# Patient Record
Sex: Female | Born: 1970 | Race: White | Hispanic: No | Smoking: Former smoker
Health system: Southern US, Community
[De-identification: ages and names within clinical notes are randomized; demographics above are authoritative.]

## PROBLEM LIST (undated history)

## (undated) DIAGNOSIS — I8290 Acute embolism and thrombosis of unspecified vein: Secondary | ICD-10-CM

## (undated) DIAGNOSIS — E119 Type 2 diabetes mellitus without complications: Secondary | ICD-10-CM

## (undated) HISTORY — PX: CHOLECYSTECTOMY: SHX55

## (undated) HISTORY — PX: ABDOMINAL SURGERY: SHX537

## (undated) HISTORY — PX: BACK SURGERY: SHX140

## (undated) HISTORY — PX: LEG SURGERY: SHX1003

---

## 2017-09-06 ENCOUNTER — Emergency Department (HOSPITAL_COMMUNITY): Admission: EM | Admit: 2017-09-06 | Discharge: 2017-09-06 | Discharge status: Against Medical Advice | Payer: Self-pay

## 2017-11-11 ENCOUNTER — Other Ambulatory Visit: Payer: Self-pay

## 2017-11-11 DIAGNOSIS — I872 Venous insufficiency (chronic) (peripheral): Secondary | ICD-10-CM

## 2017-11-11 DIAGNOSIS — I739 Peripheral vascular disease, unspecified: Secondary | ICD-10-CM

## 2017-11-25 ENCOUNTER — Other Ambulatory Visit: Payer: Self-pay | Admitting: Orthopaedic Surgery

## 2017-11-25 DIAGNOSIS — M545 Low back pain: Secondary | ICD-10-CM

## 2017-12-08 ENCOUNTER — Inpatient Hospital Stay
Admission: RE | Admit: 2017-12-08 | Discharge: 2017-12-08 | Disposition: A | Discharge status: Home / Self Care | Payer: Medicare Other | Source: Ambulatory Visit | Attending: Orthopaedic Surgery | Admitting: Orthopaedic Surgery

## 2017-12-13 ENCOUNTER — Other Ambulatory Visit: Payer: Self-pay | Admitting: *Deleted

## 2017-12-13 ENCOUNTER — Encounter: Payer: Self-pay | Admitting: Surgery

## 2017-12-13 ENCOUNTER — Encounter: Payer: Self-pay | Admitting: *Deleted

## 2017-12-13 ENCOUNTER — Encounter

## 2017-12-13 ENCOUNTER — Ambulatory Visit (INDEPENDENT_AMBULATORY_CARE_PROVIDER_SITE_OTHER): Payer: Medicare Other | Admitting: Surgery

## 2017-12-13 ENCOUNTER — Ambulatory Visit (HOSPITAL_COMMUNITY)
Admission: RE | Admit: 2017-12-13 | Discharge: 2017-12-13 | Disposition: A | Discharge status: Home / Self Care | Payer: Medicare Other | Source: Ambulatory Visit | Attending: Surgery | Admitting: Surgery

## 2017-12-13 VITALS — BP 130/90 | HR 89 | Temp 98.2°F | Resp 18 | Ht 67.2 in | Wt 238.2 lb

## 2017-12-13 DIAGNOSIS — I739 Peripheral vascular disease, unspecified: Secondary | ICD-10-CM

## 2017-12-13 DIAGNOSIS — R9439 Abnormal result of other cardiovascular function study: Secondary | ICD-10-CM | POA: Insufficient documentation
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
## 2017-12-14 ENCOUNTER — Ambulatory Visit
Admission: RE | Admit: 2017-12-14 | Discharge: 2017-12-14 | Disposition: A | Discharge status: Home / Self Care | Payer: Medicare Other | Source: Ambulatory Visit | Attending: Orthopaedic Surgery | Admitting: Orthopaedic Surgery

## 2017-12-14 DIAGNOSIS — M545 Low back pain: Secondary | ICD-10-CM

## 2018-01-04 ENCOUNTER — Encounter (HOSPITAL_COMMUNITY): Admission: RE | Payer: Self-pay | Source: Ambulatory Visit

## 2018-01-04 ENCOUNTER — Ambulatory Visit (HOSPITAL_COMMUNITY): Admission: RE | Admit: 2018-01-04 | Payer: Medicare Other | Source: Ambulatory Visit | Admitting: Surgery

## 2018-01-04 SURGERY — LOWER EXTREMITY ANGIOGRAPHY
Anesthesia: LOCAL

## 2018-01-09 ENCOUNTER — Encounter (HOSPITAL_COMMUNITY): Payer: Self-pay | Admitting: Emergency Medicine

## 2018-01-09 ENCOUNTER — Emergency Department (HOSPITAL_COMMUNITY)
Admission: EM | Admit: 2018-01-09 | Discharge: 2018-01-09 | Disposition: A | Discharge status: Home / Self Care | Payer: Medicare Other | Attending: Emergency Medicine | Admitting: Emergency Medicine

## 2018-01-09 ENCOUNTER — Emergency Department (HOSPITAL_COMMUNITY): Payer: Medicare Other

## 2018-01-09 DIAGNOSIS — R1030 Lower abdominal pain, unspecified: Secondary | ICD-10-CM | POA: Diagnosis present

## 2018-01-09 DIAGNOSIS — Z7984 Long term (current) use of oral hypoglycemic drugs: Secondary | ICD-10-CM | POA: Insufficient documentation

## 2018-01-09 DIAGNOSIS — A084 Viral intestinal infection, unspecified: Secondary | ICD-10-CM | POA: Diagnosis not present

## 2018-01-09 DIAGNOSIS — G8929 Other chronic pain: Secondary | ICD-10-CM | POA: Insufficient documentation

## 2018-01-09 DIAGNOSIS — Z79899 Other long term (current) drug therapy: Secondary | ICD-10-CM | POA: Diagnosis not present

## 2018-01-09 DIAGNOSIS — R110 Nausea: Secondary | ICD-10-CM

## 2018-01-09 DIAGNOSIS — R197 Diarrhea, unspecified: Secondary | ICD-10-CM

## 2018-01-09 DIAGNOSIS — M549 Dorsalgia, unspecified: Secondary | ICD-10-CM | POA: Diagnosis not present

## 2018-01-09 LAB — CBC
HEMATOCRIT: 44.5 % (ref 36.0–46.0)
HEMOGLOBIN: 15.2 g/dL — AB (ref 12.0–15.0)
MCH: 30 pg (ref 26.0–34.0)
MCHC: 34.2 g/dL (ref 30.0–36.0)
MCV: 87.8 fL (ref 78.0–100.0)
Platelets: 276 10*3/uL (ref 150–400)
RBC: 5.07 MIL/uL (ref 3.87–5.11)
RDW: 13.5 % (ref 11.5–15.5)
WBC: 10.7 10*3/uL — AB (ref 4.0–10.5)

## 2018-01-09 LAB — COMPREHENSIVE METABOLIC PANEL
ALT: 20 U/L (ref 0–44)
AST: 40 U/L (ref 15–41)
Albumin: 3.7 g/dL (ref 3.5–5.0)
Alkaline Phosphatase: 69 U/L (ref 38–126)
Anion gap: 16 — ABNORMAL HIGH (ref 5–15)
BILIRUBIN TOTAL: 0.8 mg/dL (ref 0.3–1.2)
BUN: 6 mg/dL (ref 6–20)
CO2: 17 mmol/L — AB (ref 22–32)
Calcium: 9.3 mg/dL (ref 8.9–10.3)
Chloride: 101 mmol/L (ref 98–111)
Creatinine, Ser: 0.59 mg/dL (ref 0.44–1.00)
GFR calc Af Amer: >60 mL/min (ref >60)
GFR calc non Af Amer: >60 mL/min (ref >60)
Glucose, Bld: 162 mg/dL — ABNORMAL HIGH (ref 70–99)
POTASSIUM: 4 mmol/L (ref 3.5–5.1)
SODIUM: 134 mmol/L — AB (ref 135–145)
TOTAL PROTEIN: 7.4 g/dL (ref 6.5–8.1)

## 2018-01-09 LAB — I-STAT BETA HCG BLOOD, ED (MC, WL, AP ONLY): I-stat hCG, quantitative: <5 m[IU]/mL (ref <5)

## 2018-01-09 LAB — URINALYSIS, ROUTINE W REFLEX MICROSCOPIC
Bilirubin Urine: NEGATIVE
Glucose, UA: NEGATIVE mg/dL
Hgb urine dipstick: NEGATIVE
Ketones, ur: 5 mg/dL — AB
LEUKOCYTES UA: NEGATIVE
NITRITE: NEGATIVE
PH: 5 (ref 5.0–8.0)
Protein, ur: NEGATIVE mg/dL
Specific Gravity, Urine: 1.021 (ref 1.005–1.030)

## 2018-01-09 LAB — BLOOD GAS, VENOUS
Acid-Base Excess: 0.5 mmol/L (ref 0.0–2.0)
Bicarbonate: 24.1 mmol/L (ref 20.0–28.0)
FIO2: 21
O2 Saturation: 83.2 %
PO2 VEN: 45.5 mmHg — AB (ref 32.0–45.0)
Patient temperature: 98.4
pCO2, Ven: 37.1 mmHg — ABNORMAL LOW (ref 44.0–60.0)
pH, Ven: 7.428 (ref 7.250–7.430)

## 2018-01-09 LAB — LIPASE, BLOOD: Lipase: 45 U/L (ref 11–51)

## 2018-01-09 MED ORDER — ONDANSETRON HCL 4 MG/2ML IJ SOLN
4.0000 mg | Freq: Once | INTRAMUSCULAR | Status: AC
  Administered 2018-01-09: 4 mg via INTRAVENOUS
  Filled 2018-01-09: qty 2

## 2018-01-09 MED ORDER — DICYCLOMINE HCL 20 MG PO TABS: 20.0000 mg | ORAL_TABLET | Freq: Three times a day (TID) | ORAL | 0 refills | Status: DC | PRN

## 2018-01-09 MED ORDER — SODIUM CHLORIDE 0.9 % IV BOLUS
2000.0000 mL | Freq: Once | INTRAVENOUS | Status: AC
  Administered 2018-01-09: 2000 mL via INTRAVENOUS

## 2018-01-09 MED ORDER — ONDANSETRON 4 MG PO TBDP: 4.0000 mg | ORAL_TABLET | Freq: Three times a day (TID) | ORAL | 0 refills | Status: DC | PRN

## 2018-01-09 MED ORDER — IOPAMIDOL (ISOVUE-300) INJECTION 61%
100.0000 mL | Freq: Once | INTRAVENOUS | Status: AC | PRN
  Administered 2018-01-09: 100 mL via INTRAVENOUS

## 2018-01-09 MED ORDER — MORPHINE SULFATE (PF) 4 MG/ML IV SOLN
4.0000 mg | Freq: Once | INTRAVENOUS | Status: AC
  Administered 2018-01-09: 4 mg via INTRAVENOUS
  Filled 2018-01-09: qty 1

## 2018-01-09 MED ORDER — IOPAMIDOL (ISOVUE-300) INJECTION 61%
INTRAVENOUS | Status: AC
  Filled 2018-01-09: qty 100
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
## 2018-01-09 NOTE — ED Triage Notes (Addendum)
Patient here from home with complaints of abdominal pain. Nausea, states that she is on an antibiotic for colitis. States that she has abdominal distention and has put herself on a liquid diet. Doxy 100mg  twice daily.

## 2018-01-09 NOTE — ED Notes (Signed)
Urine culture sent down with UA. 

## 2018-01-09 NOTE — Discharge Instructions (Addendum)
Your work up today has been very reassuring, your CT scan did not show any signs of colitis or diverticulitis. You possibly could have a viral gastroenteritis illness, which will resolve on it's own, however you should continue your doxycycline until you've finished the entire prescription. Use zofran as prescribed, as needed for nausea. Alternate between tylenol and motrin as needed for pain. May consider using over the counter tums, maalox, pepto bismol, or other over the counter remedies to help with symptoms. Stay well hydrated with small sips of fluids throughout the day. Use bentyl as directed to help with diarrhea and abdominal pain. Follow a BRAT (banana-rice-applesauce-toast) diet as described below for the next 24-48 hours. The 'BRAT' diet is suggested, then progress to diet as tolerated as symptoms abate. Call your regular doctor if bloody stools, persistent diarrhea, vomiting, fever or abdominal pain. Follow up with your regular doctor in 1 week for recheck of symptoms. Return to ER for changing or worsening of symptoms.

## 2018-05-08 ENCOUNTER — Ambulatory Visit (HOSPITAL_COMMUNITY)
Admission: EM | Admit: 2018-05-08 | Discharge: 2018-05-08 | Disposition: A | Discharge status: Home / Self Care | Payer: Medicare Other | Attending: Family Medicine | Admitting: Family Medicine

## 2018-05-08 ENCOUNTER — Ambulatory Visit (HOSPITAL_COMMUNITY): Payer: Medicare Other

## 2018-05-08 ENCOUNTER — Encounter (HOSPITAL_COMMUNITY): Payer: Self-pay | Admitting: Emergency Medicine

## 2018-05-08 DIAGNOSIS — B9789 Other viral agents as the cause of diseases classified elsewhere: Secondary | ICD-10-CM

## 2018-05-08 DIAGNOSIS — J069 Acute upper respiratory infection, unspecified: Secondary | ICD-10-CM

## 2018-05-08 HISTORY — DX: Acute embolism and thrombosis of unspecified vein: I82.90

## 2018-05-08 HISTORY — DX: Type 2 diabetes mellitus without complications: E11.9

## 2018-05-08 NOTE — ED Triage Notes (Signed)
Pt sts URI sx with congestion and cough
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
## 2018-05-08 NOTE — ED Notes (Signed)
Patient was sitting in hall, had pulled chair out into the hallway, was upset with the temperature in room. Patient refused CXR exam.

## 2018-10-14 ENCOUNTER — Other Ambulatory Visit: Payer: Self-pay

## 2018-10-14 DIAGNOSIS — I739 Peripheral vascular disease, unspecified: Secondary | ICD-10-CM

## 2018-10-24 ENCOUNTER — Other Ambulatory Visit: Payer: Self-pay

## 2018-10-24 ENCOUNTER — Ambulatory Visit (INDEPENDENT_AMBULATORY_CARE_PROVIDER_SITE_OTHER): Payer: Medicare Other | Admitting: Surgery

## 2018-10-24 ENCOUNTER — Ambulatory Visit (HOSPITAL_COMMUNITY)
Admission: RE | Admit: 2018-10-24 | Discharge: 2018-10-24 | Disposition: A | Discharge status: Home / Self Care | Payer: Medicare Other | Source: Ambulatory Visit | Attending: Family | Admitting: Family

## 2018-10-24 ENCOUNTER — Encounter: Payer: Self-pay | Admitting: Surgery

## 2018-10-24 VITALS — BP 98/68 | HR 85 | Temp 99.1°F | Ht 67.0 in | Wt 221.9 lb

## 2018-10-24 DIAGNOSIS — I739 Peripheral vascular disease, unspecified: Secondary | ICD-10-CM | POA: Diagnosis not present

## 2018-10-24 MED ORDER — ROSUVASTATIN CALCIUM 10 MG PO TABS: 10.0000 mg | ORAL_TABLET | Freq: Every day | ORAL | 11 refills | Status: AC

## 2018-10-24 MED ORDER — CILOSTAZOL 100 MG PO TABS: 100.0000 mg | ORAL_TABLET | Freq: Two times a day (BID) | ORAL | 11 refills | Status: DC
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
## 2019-01-16 ENCOUNTER — Other Ambulatory Visit: Payer: Self-pay

## 2019-01-16 ENCOUNTER — Emergency Department (HOSPITAL_COMMUNITY)
Admission: EM | Admit: 2019-01-16 | Discharge: 2019-01-16 | Disposition: A | Discharge status: Home / Self Care | Payer: Medicare Other | Attending: Emergency Medicine | Admitting: Emergency Medicine

## 2019-01-16 ENCOUNTER — Encounter (HOSPITAL_COMMUNITY): Payer: Self-pay | Admitting: Emergency Medicine

## 2019-01-16 DIAGNOSIS — Z79899 Other long term (current) drug therapy: Secondary | ICD-10-CM | POA: Insufficient documentation

## 2019-01-16 DIAGNOSIS — Z87891 Personal history of nicotine dependence: Secondary | ICD-10-CM | POA: Diagnosis not present

## 2019-01-16 DIAGNOSIS — R070 Pain in throat: Secondary | ICD-10-CM | POA: Insufficient documentation

## 2019-01-16 DIAGNOSIS — Z7984 Long term (current) use of oral hypoglycemic drugs: Secondary | ICD-10-CM | POA: Diagnosis not present

## 2019-01-16 DIAGNOSIS — E119 Type 2 diabetes mellitus without complications: Secondary | ICD-10-CM | POA: Diagnosis not present

## 2019-01-16 DIAGNOSIS — Z77098 Contact with and (suspected) exposure to other hazardous, chiefly nonmedicinal, chemicals: Secondary | ICD-10-CM

## 2019-01-16 MED ORDER — LIDOCAINE VISCOUS HCL 2 % MT SOLN: 15.0000 mL | OROMUCOSAL | 0 refills | Status: DC | PRN

## 2019-01-16 MED ORDER — LIDOCAINE VISCOUS HCL 2 % MT SOLN
15.0000 mL | Freq: Once | OROMUCOSAL | Status: AC
  Administered 2019-01-16: 15 mL via OROMUCOSAL
  Filled 2019-01-16: qty 15

## 2019-01-16 NOTE — Discharge Instructions (Addendum)
Use viscous lidocaine as needed for throat irritation. You may use natural tears as needed for eye irritation. Follow-up with your primary care doctor if your symptoms are not improving in the next few days.  Return to emergency room if you develop increased shortness breath or difficulty breathing.  Return with any new problems, concerns.

## 2019-01-16 NOTE — ED Notes (Signed)
Into introduce self to patient, pt upset about waiting for provider, apologized for delay. Pt cursing at nurse. Updated pt that a provider will be in to see her when available.
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
## 2019-01-23 ENCOUNTER — Ambulatory Visit: Payer: Medicare Other | Admitting: Surgery

## 2019-02-01 ENCOUNTER — Encounter (HOSPITAL_COMMUNITY): Payer: Self-pay

## 2019-02-01 ENCOUNTER — Other Ambulatory Visit: Payer: Self-pay

## 2019-02-01 ENCOUNTER — Emergency Department (HOSPITAL_COMMUNITY)
Admission: EM | Admit: 2019-02-01 | Discharge: 2019-02-02 | Discharge status: Against Medical Advice | Payer: Medicare Other | Attending: Emergency Medicine | Admitting: Emergency Medicine

## 2019-02-01 DIAGNOSIS — X118XXA Contact with other hot tap-water, initial encounter: Secondary | ICD-10-CM | POA: Insufficient documentation

## 2019-02-01 DIAGNOSIS — Y939 Activity, unspecified: Secondary | ICD-10-CM | POA: Diagnosis not present

## 2019-02-01 DIAGNOSIS — Y929 Unspecified place or not applicable: Secondary | ICD-10-CM | POA: Insufficient documentation

## 2019-02-01 DIAGNOSIS — Z5321 Procedure and treatment not carried out due to patient leaving prior to being seen by health care provider: Secondary | ICD-10-CM | POA: Diagnosis not present

## 2019-02-01 DIAGNOSIS — T2122XA Burn of second degree of abdominal wall, initial encounter: Secondary | ICD-10-CM | POA: Diagnosis present

## 2019-02-01 DIAGNOSIS — Y999 Unspecified external cause status: Secondary | ICD-10-CM | POA: Diagnosis not present
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
## 2019-02-02 ENCOUNTER — Other Ambulatory Visit: Payer: Self-pay

## 2019-02-02 ENCOUNTER — Encounter (HOSPITAL_COMMUNITY): Payer: Self-pay

## 2019-02-02 ENCOUNTER — Emergency Department (HOSPITAL_COMMUNITY)
Admission: EM | Admit: 2019-02-02 | Discharge: 2019-02-02 | Disposition: A | Discharge status: Home / Self Care | Payer: Medicare Other | Source: Home / Self Care | Attending: Emergency Medicine | Admitting: Emergency Medicine

## 2019-02-02 DIAGNOSIS — Y999 Unspecified external cause status: Secondary | ICD-10-CM | POA: Insufficient documentation

## 2019-02-02 DIAGNOSIS — E119 Type 2 diabetes mellitus without complications: Secondary | ICD-10-CM | POA: Insufficient documentation

## 2019-02-02 DIAGNOSIS — Y92000 Kitchen of unspecified non-institutional (private) residence as  the place of occurrence of the external cause: Secondary | ICD-10-CM | POA: Insufficient documentation

## 2019-02-02 DIAGNOSIS — Y93G3 Activity, cooking and baking: Secondary | ICD-10-CM | POA: Insufficient documentation

## 2019-02-02 DIAGNOSIS — Z5321 Procedure and treatment not carried out due to patient leaving prior to being seen by health care provider: Secondary | ICD-10-CM | POA: Diagnosis not present

## 2019-02-02 DIAGNOSIS — T2122XA Burn of second degree of abdominal wall, initial encounter: Secondary | ICD-10-CM | POA: Insufficient documentation

## 2019-02-02 DIAGNOSIS — Z23 Encounter for immunization: Secondary | ICD-10-CM | POA: Insufficient documentation

## 2019-02-02 DIAGNOSIS — Z7984 Long term (current) use of oral hypoglycemic drugs: Secondary | ICD-10-CM | POA: Insufficient documentation

## 2019-02-02 DIAGNOSIS — T2121XA Burn of second degree of chest wall, initial encounter: Secondary | ICD-10-CM | POA: Insufficient documentation

## 2019-02-02 DIAGNOSIS — T2102XA Burn of unspecified degree of abdominal wall, initial encounter: Secondary | ICD-10-CM

## 2019-02-02 DIAGNOSIS — X12XXXA Contact with other hot fluids, initial encounter: Secondary | ICD-10-CM | POA: Insufficient documentation

## 2019-02-02 DIAGNOSIS — Z79899 Other long term (current) drug therapy: Secondary | ICD-10-CM | POA: Insufficient documentation

## 2019-02-02 MED ORDER — BACITRACIN ZINC 500 UNIT/GM EX OINT
TOPICAL_OINTMENT | Freq: Once | CUTANEOUS | Status: AC
  Administered 2019-02-02: 1 via TOPICAL
  Filled 2019-02-02: qty 4.5

## 2019-02-02 MED ORDER — NAPROXEN 500 MG PO TABS: 500.0000 mg | ORAL_TABLET | Freq: Two times a day (BID) | ORAL | 0 refills | Status: DC

## 2019-02-02 MED ORDER — BACITRACIN ZINC 500 UNIT/GM EX OINT: 1.0000 "application " | TOPICAL_OINTMENT | Freq: Two times a day (BID) | CUTANEOUS | 0 refills | Status: AC

## 2019-02-02 MED ORDER — TETANUS-DIPHTH-ACELL PERTUSSIS 5-2.5-18.5 LF-MCG/0.5 IM SUSP
0.5000 mL | Freq: Once | INTRAMUSCULAR | Status: AC
  Administered 2019-02-02: 0.5 mL via INTRAMUSCULAR
  Filled 2019-02-02: qty 0.5

## 2019-02-02 MED ORDER — KETOROLAC TROMETHAMINE 60 MG/2ML IM SOLN
30.0000 mg | Freq: Once | INTRAMUSCULAR | Status: AC
  Administered 2019-02-02: 30 mg via INTRAMUSCULAR
  Filled 2019-02-02: qty 2
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
## 2019-02-02 NOTE — ED Notes (Addendum)
Pt upset about waiting in the lobby. She said, "that's your triage? Just telling me to go to the lobby." Pt abdomen assessed, pain assessed, vitals taken, and narrative recorded. Writer asked if there was anything else that she needed in the chart and she declined. Pt walked out to the lobby.
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
## 2019-02-07 ENCOUNTER — Ambulatory Visit (INDEPENDENT_AMBULATORY_CARE_PROVIDER_SITE_OTHER): Payer: Medicare Other | Admitting: Nurse Practitioner

## 2019-02-07 ENCOUNTER — Other Ambulatory Visit: Payer: Self-pay

## 2019-02-07 ENCOUNTER — Encounter: Payer: Self-pay | Admitting: Nurse Practitioner

## 2019-02-07 VITALS — BP 125/81 | HR 98 | Temp 98.0°F | Ht 67.0 in | Wt 215.0 lb

## 2019-02-07 DIAGNOSIS — T2122XA Burn of second degree of abdominal wall, initial encounter: Secondary | ICD-10-CM | POA: Diagnosis not present

## 2019-02-21 ENCOUNTER — Ambulatory Visit: Payer: Medicare Other | Admitting: Nurse Practitioner

## 2019-08-24 ENCOUNTER — Other Ambulatory Visit: Payer: Self-pay | Admitting: Surgery

## 2020-06-24 ENCOUNTER — Other Ambulatory Visit: Payer: Self-pay

## 2020-06-24 ENCOUNTER — Emergency Department (HOSPITAL_COMMUNITY)
Admission: EM | Admit: 2020-06-24 | Discharge: 2020-06-24 | Disposition: A | Discharge status: Home / Self Care | Payer: Medicare Other | Attending: Emergency Medicine | Admitting: Emergency Medicine

## 2020-06-24 ENCOUNTER — Encounter (HOSPITAL_COMMUNITY): Payer: Self-pay

## 2020-06-24 DIAGNOSIS — Z5321 Procedure and treatment not carried out due to patient leaving prior to being seen by health care provider: Secondary | ICD-10-CM | POA: Diagnosis not present

## 2020-06-24 DIAGNOSIS — S91352A Open bite, left foot, initial encounter: Secondary | ICD-10-CM | POA: Insufficient documentation

## 2020-06-24 DIAGNOSIS — W503XXA Accidental bite by another person, initial encounter: Secondary | ICD-10-CM | POA: Diagnosis not present
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
## 2020-06-25 ENCOUNTER — Ambulatory Visit (HOSPITAL_COMMUNITY)
Admission: EM | Admit: 2020-06-25 | Discharge: 2020-06-25 | Disposition: A | Discharge status: Home / Self Care | Payer: Medicare Other | Attending: Urgent Care | Admitting: Urgent Care

## 2020-06-25 ENCOUNTER — Other Ambulatory Visit: Payer: Self-pay

## 2020-06-25 ENCOUNTER — Ambulatory Visit (INDEPENDENT_AMBULATORY_CARE_PROVIDER_SITE_OTHER): Payer: Medicare Other

## 2020-06-25 ENCOUNTER — Encounter (HOSPITAL_COMMUNITY): Payer: Self-pay

## 2020-06-25 DIAGNOSIS — L089 Local infection of the skin and subcutaneous tissue, unspecified: Secondary | ICD-10-CM

## 2020-06-25 DIAGNOSIS — E1165 Type 2 diabetes mellitus with hyperglycemia: Secondary | ICD-10-CM

## 2020-06-25 DIAGNOSIS — M79672 Pain in left foot: Secondary | ICD-10-CM

## 2020-06-25 DIAGNOSIS — L02619 Cutaneous abscess of unspecified foot: Secondary | ICD-10-CM | POA: Diagnosis not present

## 2020-06-25 DIAGNOSIS — L03119 Cellulitis of unspecified part of limb: Secondary | ICD-10-CM

## 2020-06-25 DIAGNOSIS — IMO0002 Reserved for concepts with insufficient information to code with codable children: Secondary | ICD-10-CM

## 2020-06-25 LAB — CBG MONITORING, ED: Glucose-Capillary: 175 mg/dL — ABNORMAL HIGH (ref 70–99)

## 2020-06-25 MED ORDER — DOXYCYCLINE HYCLATE 100 MG PO CAPS: 100.0000 mg | ORAL_CAPSULE | Freq: Two times a day (BID) | ORAL | 0 refills | Status: DC

## 2020-06-25 NOTE — Discharge Instructions (Addendum)
Please change your dressing 2-3 times daily. Do not apply any ointments or creams. Each time you change your dressing, make sure you clean gently around the perimeter of the wound with gentle soap and warm water. Pat your wound dry and let it air out if possible for 1-2 hours before reapplying another dressing. Do not use any nonsteroidal anti-inflammatories (NSAIDs) like ibuprofen, Motrin, naproxen, Aleve, etc. which are all available over-the-counter.  Please just use Tylenol at a dose of 500mg -650mg  once every 6 hours as needed for your aches, pains, fevers.

## 2020-06-25 NOTE — ED Triage Notes (Signed)
Pt presents with left foot pain X 4 days. Pt states she noticed an abscess on the bottom of her foot. She states she is unable to keep her foot down or stand on it. Pt states keeping her foot elevated makes relieves the pain.

## 2020-06-25 NOTE — ED Provider Notes (Signed)
[facility]   MRN: [id number] DOB: 10/30/1970  Subjective:   Bethany Zimmerman is a 49 y.o. female presenting for 2-week history of progressively worsening left foot pain.  Patient states that she started to use a treadmill and noticed soreness of her left foot.  It progressed to a sore/abscess.  She applied over-the-counter medications but has started to drain more.  Has kept her wound covered with dressing and tape.  She is doing Epson soaks.  In the last 4 days her foot has gotten more red and is hurting further up toward the lower end of her left leg.  She did notice red spots over the right leg today as well without any pain, itching or drainage.  Denies fever, nausea, vomiting.  Patient has a diagnosis of diabetes and has been managing this very strictly.  States that she is lost over 100 pounds in the past year.  No current facility-administered medications for this encounter.  Current Outpatient Medications:  .  bacitracin ointment, Apply 1 application topically 2 (two) times daily., Disp: 120 g, Rfl: 0 .  Blood Glucose Monitoring Suppl (ONETOUCH VERIO FLEX SYSTEM) w/Device KIT, 1 (ONE) KIT AS DIRECTED, Disp: , Rfl: 0 .  cilostazol (PLETAL) 100 MG tablet, TAKE 1 TABLET (100 MG TOTAL) BY MOUTH 2 (TWO) TIMES DAILY BEFORE A MEAL., Disp: 180 tablet, Rfl: 3 .  fluticasone (FLONASE) 50 MCG/ACT nasal spray, , Disp: , Rfl:  .  gabapentin (NEURONTIN) 800 MG tablet, Take 800 mg by mouth 3 (three) times daily., Disp: , Rfl: 1 .  JANUVIA 100 MG tablet, Take 100 mg by mouth daily., Disp: , Rfl:  .  ketoconazole (NIZORAL) 2 % cream, Apply 1 application topically daily., Disp: , Rfl: 1 .  metFORMIN (GLUCOPHAGE) 1000 MG tablet, Take 1,000 mg by mouth 2 (two) times daily., Disp: , Rfl:  .  methocarbamol (ROBAXIN) 750 MG tablet, Take 750 mg by mouth 3 (three) times daily., Disp: , Rfl:  .  omeprazole (PRILOSEC) 20 MG capsule, TAKE 1 CAPSULE BY MOUTH TWICE A DAY AS NEEDED, Disp: , Rfl:   .  ONETOUCH VERIO test strip, TEST 3 TIMES A DAY E11.65, Disp: , Rfl: 2 .  rosuvastatin (CRESTOR) 10 MG tablet, Take 1 tablet (10 mg total) by mouth at bedtime., Disp: 30 tablet, Rfl: 11   Allergies  Allergen Reactions  . Erythromycin Anaphylaxis  . Sulfa Antibiotics Anaphylaxis, Shortness Of Breath and Swelling  . Clindamycin/Lincomycin Hives    Mycin drugs    Past Medical History:  Diagnosis Date  . Diabetes mellitus without complication [facility])      Past Surgical History:  Procedure Laterality Date  . ABDOMINAL SURGERY    . CESAREAN SECTION    . CHOLECYSTECTOMY      Family History  Problem Relation Age of Onset  . Heart failure Mother     Social History   Tobacco Use  . Smoking status: Former Research scientist (life sciences)  . Smokeless tobacco: Never Used  Vaping Use  . Vaping Use: Never used  Substance Use Topics  . Alcohol use: Never  . Drug use: Never    ROS   Objective:   Vitals: BP 123/72 (BP Location: Left Arm)   Pulse 99   Temp 98.1 F (36.7 C) (Oral)   Resp 17   SpO2 96%   Physical Exam Constitutional:      General: She is not in acute distress.    Appearance: Normal appearance. She is well-developed. She  is not ill-appearing, toxic-appearing or diaphoretic.  HENT:     Head: Normocephalic and atraumatic.     Nose: Nose normal.     Mouth/Throat:     Mouth: Mucous membranes are moist.     Pharynx: Oropharynx is clear.  Eyes:     General: No scleral icterus.    Extraocular Movements: Extraocular movements intact.     Pupils: Pupils are equal, round, and reactive to light.  Cardiovascular:     Rate and Rhythm: Normal rate.  Pulmonary:     Effort: Pulmonary effort is normal.  Musculoskeletal:       Feet:  Skin:    General: Skin is warm and dry.  Neurological:     General: No focal deficit present.     Mental Status: She is alert and oriented to person, place, and time.     Motor: No weakness.     Coordination: Coordination normal.     Gait: Gait normal.      Deep Tendon Reflexes: Reflexes normal.  Psychiatric:        Mood and Affect: Mood normal.        Behavior: Behavior normal.        Thought Content: Thought content normal.        Judgment: Judgment normal.          Results for orders placed or performed during the hospital encounter of 06/25/20 (from the past 24 hour(s))  POC CBG monitoring     Status: Abnormal   Collection Time: 06/25/20 10:14 AM  Result Value Ref Range   Glucose-Capillary 175 (H) 70 - 99 mg/dL   Last meal was 8:30 AM this morning.  DG Foot Complete Left  Result Date: 06/25/2020 CLINICAL DATA:  Soft tissue infection for 1 week EXAM: LEFT FOOT - COMPLETE 3+ VIEW COMPARISON:  None. FINDINGS: Soft tissue swelling is noted about the fifth digit consistent with the given clinical history. No definitive bony erosive changes are identified to suggest osteomyelitis. Questionable lucency is noted in the base of the fifth proximal phalanx which may be related to an undisplaced fracture. IMPRESSION: No definitive bony erosive changes to suggest osteomyelitis. MRI would be more sensitive in this regard. Question undisplaced fracture at the base of the fifth proximal phalanx. Electronically Signed   By: Mark  Lukens M.D.   On: 06/25/2020 10:19   Assessment and Plan :   PDMP not reviewed this encounter.  1. Abscess or cellulitis of foot   2. Left foot pain   3. Uncontrolled type 2 diabetes mellitus with hyperglycemia (H[facility])     Start doxycycline to address abscess and cellulitis of her left foot.  Counseled on wound care.  Recommended Tylenol for pain control, no NSAIDs.  Follow-up within 48 hours if symptoms not improving, strict ER precautions. Counseled patient on potential for adverse effects with medications prescribed today, patient verbalized understanding.    Mani, Mario, PA-C 06/25/20 1030  

## 2020-08-07 ENCOUNTER — Other Ambulatory Visit: Payer: Self-pay

## 2020-08-07 ENCOUNTER — Ambulatory Visit (HOSPITAL_COMMUNITY)
Admission: EM | Admit: 2020-08-07 | Discharge: 2020-08-07 | Disposition: A | Discharge status: Home / Self Care | Payer: Medicare Other | Attending: Internal Medicine | Admitting: Internal Medicine

## 2020-08-07 ENCOUNTER — Encounter (HOSPITAL_COMMUNITY): Payer: Self-pay | Admitting: *Deleted

## 2020-08-07 DIAGNOSIS — L97529 Non-pressure chronic ulcer of other part of left foot with unspecified severity: Secondary | ICD-10-CM

## 2020-08-07 DIAGNOSIS — L03116 Cellulitis of left lower limb: Secondary | ICD-10-CM | POA: Diagnosis not present

## 2020-08-07 MED ORDER — DOXYCYCLINE HYCLATE 100 MG PO CAPS: 100.0000 mg | ORAL_CAPSULE | Freq: Two times a day (BID) | ORAL | 0 refills | Status: DC

## 2020-08-07 NOTE — ED Triage Notes (Signed)
Pt seen on 06-25-20 for infection of Lt foot. Pt completed all the Anti bx and fott was looking better. Pt was referred to wound center today because Lt foot has started to look infected again. Pt reports the wound center can not see her until March. Pt reports she can not wait till march ,so she has returned to [facility] for treatment .

## 2020-08-07 NOTE — Discharge Instructions (Signed)
Continue doing the the soaks and dressing you have been doing. Come back if you get worse. If you develop a streak, you will need IV antibiotics and you need to go to ER.  Please follow up with a primary care doctor in 7-10 days.

## 2020-08-07 NOTE — ED Provider Notes (Signed)
[place]    CSN: [id number] Arrival date & time: 08/07/20  1622      History   Chief Complaint Chief Complaint  Patient presents with  . Foot Problem    LT    HPI Bethany Zimmerman is a 50 y.o. female who presents with recurrent L foot sole infection x 3 days when she noticed it draining green matter. Has been soaking it in Epson salts and changing the dressing as taught by Bess Harvest last time, but when she saw her small toe red today, she knew she needed to come. The prior time got this way and developed a red streak from it. She Was treated here 06/25/20 with Doxy and was got better. She was referred to the wound clinic by her pain MD whom she saw this week, but cant be seen until March. Her DM is in good control. Her fasting glucose have been less than 90.     Past Medical History:  Diagnosis Date  . Diabetes mellitus without complication ([place])     There are no problems to display for this patient.   Past Surgical History:  Procedure Laterality Date  . ABDOMINAL SURGERY    . CESAREAN SECTION    . CHOLECYSTECTOMY      OB History   No obstetric history on file.      Home Medications    Prior to Admission medications   Medication Sig Start Date End Date Taking? Authorizing Provider  bacitracin ointment Apply 1 application topically 2 (two) times daily. 02/02/19   Petrucelli, Samantha R, PA-C  Blood Glucose Monitoring Suppl ([facility]) w/Device KIT 1 (ONE) KIT AS DIRECTED 11/18/17   [provider]  cilostazol (PLETAL) 100 MG tablet TAKE 1 TABLET (100 MG TOTAL) BY MOUTH 2 (TWO) TIMES DAILY BEFORE A MEAL. 08/24/19   Serafina Mitchell, MD  doxycycline (VIBRAMYCIN) 100 MG capsule Take 1 capsule (100 mg total) by mouth 2 (two) times daily. 06/25/20   Jaynee Eagles, PA-C  fluticasone Asencion Islam) 50 MCG/ACT nasal spray  10/15/18   [provider]  gabapentin (NEURONTIN) 800 MG tablet Take 800 mg by mouth 3 (three) times daily. 12/13/17    [provider]  JANUVIA 100 MG tablet Take 100 mg by mouth daily. 08/19/18   [provider]  ketoconazole (NIZORAL) 2 % cream Apply 1 application topically daily. 12/17/17   [provider]  metFORMIN (GLUCOPHAGE) 1000 MG tablet Take 1,000 mg by mouth 2 (two) times daily.    [provider]  methocarbamol (ROBAXIN) 750 MG tablet Take 750 mg by mouth 3 (three) times daily. 10/20/18   [provider]  omeprazole (PRILOSEC) 20 MG capsule TAKE 1 CAPSULE BY MOUTH TWICE A DAY AS NEEDED 09/30/18   [provider]  ONETOUCH VERIO test strip TEST 3 TIMES A DAY E11.65 11/18/17   [provider]  rosuvastatin (CRESTOR) 10 MG tablet Take 1 tablet (10 mg total) by mouth at bedtime. 10/24/18 10/24/19  Serafina Mitchell, MD    Family History Family History  Problem Relation Age of Onset  . Heart failure Mother     Social History Social History   Tobacco Use  . Smoking status: Former Research scientist (life sciences)  . Smokeless tobacco: Never Used  Vaping Use  . Vaping Use: Never used  Substance Use Topics  . Alcohol use: Never  . Drug use: Never     Allergies   Erythromycin, Sulfa antibiotics, and Clindamycin/lincomycin   Review of  Systems Review of Systems + for painful wound on L sole of foot, with draining and redness of L small toe. The rest is neg.   Physical Exam Triage Vital Signs ED Triage Vitals  Enc Vitals Group     BP 08/07/20 1648 114/74     Pulse Rate 08/07/20 1648 90     Resp 08/07/20 1648 18     Temp 08/07/20 1648 98.1 F (36.7 C)     Temp Source 08/07/20 1648 Oral     SpO2 08/07/20 1648 99 %     Weight --      Height --      Head Circumference --      Peak Flow --      Pain Score 08/07/20 1645 6     Pain Loc --      Pain Edu? --      Excl. in [place]? --    No data found.  Updated Vital Signs BP 114/74 (BP Location: Left Arm)   Pulse 90   Temp 98.1 F (36.7 C) (Oral)   Resp 18   SpO2 99%   Visual Acuity Right Eye Distance:    Left Eye Distance:   Bilateral Distance:    Right Eye Near:   Left Eye Near:    Bilateral Near:     Physical Exam Skin:    General: Skin is warm and dry.     Comments: L dorsal foot with  4x4 mm ulcer which seems to be stage 1 with mild warmth around it, but her L small toe is warm and erythematous. No streaking noted.       UC Treatments / Results  Labs (all labs ordered are listed, but only abnormal results are displayed) Labs Reviewed - No data to display  EKG   Radiology No results found.  Procedures Procedures (including critical care time)  Medications Ordered in UC Medications - No data to display  Initial Impression / Assessment and Plan / UC Course  I have reviewed the triage vital signs and the nursing notes. Recurrent cellulitis L foot with small ulcer.  I placed her on Doxy since she responded to this, but extended it to 14 days. Encouraged to find a new PCP which she is already looking for so she can follow up in 10 days. See instructions.   Final Clinical Impressions(s) / UC Diagnoses   Final diagnoses:  None   Discharge Instructions   None    ED Prescriptions    None     PDMP not reviewed this encounter.   Shelby Mattocks, [place] 08/07/20 1727

## 2020-08-14 ENCOUNTER — Encounter (HOSPITAL_COMMUNITY): Payer: Self-pay

## 2020-08-14 ENCOUNTER — Ambulatory Visit (HOSPITAL_COMMUNITY)
Admission: EM | Admit: 2020-08-14 | Discharge: 2020-08-14 | Disposition: A | Discharge status: Home / Self Care | Payer: Medicare Other | Attending: Family Medicine | Admitting: Family Medicine

## 2020-08-14 ENCOUNTER — Other Ambulatory Visit: Payer: Self-pay

## 2020-08-14 DIAGNOSIS — T887XXA Unspecified adverse effect of drug or medicament, initial encounter: Secondary | ICD-10-CM | POA: Diagnosis not present

## 2020-08-14 DIAGNOSIS — R112 Nausea with vomiting, unspecified: Secondary | ICD-10-CM | POA: Diagnosis not present

## 2020-08-14 DIAGNOSIS — L03116 Cellulitis of left lower limb: Secondary | ICD-10-CM | POA: Diagnosis not present

## 2020-08-14 MED ORDER — ONDANSETRON 4 MG PO TBDP: 4.0000 mg | ORAL_TABLET | Freq: Three times a day (TID) | ORAL | 0 refills | Status: DC | PRN

## 2020-08-14 MED ORDER — MISC. DEVICES MISC: 0 refills | Status: AC

## 2020-08-14 NOTE — ED Provider Notes (Signed)
[place]    CSN: [id number] Arrival date & time: 08/14/20  0909      History   Chief Complaint Chief Complaint  Patient presents with  . Abdominal Pain  . Vomiting    HPI Bethany Zimmerman is a 50 y.o. female.   Here today with several days of N/V that seems to be associated with the antibiotic she is on since it's only happening after doses. She held the medication for a few days and had no issues. Denies fever, chills, diarrhea, abdominal pain, rashes, or other sick sxs. Has not tried anything for the sxs. She also states she needs a script for a walker and a note saying she needs to limit activity due to her leg infection/injury.      Past Medical History:  Diagnosis Date  . Diabetes mellitus without complication ([place])     There are no problems to display for this patient.   Past Surgical History:  Procedure Laterality Date  . ABDOMINAL SURGERY    . CESAREAN SECTION    . CHOLECYSTECTOMY      OB History   No obstetric history on file.      Home Medications    Prior to Admission medications   Medication Sig Start Date End Date Taking? Authorizing Provider  Misc. Devices MISC Walker with seat Dx: M62.81 08/14/20  Yes Volney American, PA-C  ondansetron (ZOFRAN ODT) 4 MG disintegrating tablet Take 1 tablet (4 mg total) by mouth every 8 (eight) hours as needed for nausea or vomiting. 08/14/20  Yes Volney American, PA-C  bacitracin ointment Apply 1 application topically 2 (two) times daily. 02/02/19   Petrucelli, Samantha R, PA-C  Blood Glucose Monitoring Suppl ([place]) w/Device KIT 1 (ONE) KIT AS DIRECTED 11/18/17   [provider]  cilostazol (PLETAL) 100 MG tablet TAKE 1 TABLET (100 MG TOTAL) BY MOUTH 2 (TWO) TIMES DAILY BEFORE A MEAL. 08/24/19   Serafina Mitchell, MD  doxycycline (VIBRAMYCIN) 100 MG capsule Take 1 capsule (100 mg total) by mouth 2 (two) times daily. 08/07/20   Rodriguez-Southworth, Sunday Spillers, PA-C   fluticasone (FLONASE) 50 MCG/ACT nasal spray  10/15/18   [provider]  gabapentin (NEURONTIN) 800 MG tablet Take 800 mg by mouth 3 (three) times daily. 12/13/17   [provider]  JANUVIA 100 MG tablet Take 100 mg by mouth daily. 08/19/18   [provider]  ketoconazole (NIZORAL) 2 % cream Apply 1 application topically daily. 12/17/17   [provider]  metFORMIN (GLUCOPHAGE) 1000 MG tablet Take 1,000 mg by mouth 2 (two) times daily.    [provider]  methocarbamol (ROBAXIN) 750 MG tablet Take 750 mg by mouth 3 (three) times daily. 10/20/18   [provider]  omeprazole (PRILOSEC) 20 MG capsule TAKE 1 CAPSULE BY MOUTH TWICE A DAY AS NEEDED 09/30/18   [provider]  ONETOUCH VERIO test strip TEST 3 TIMES A DAY E11.65 11/18/17   [provider]  rosuvastatin (CRESTOR) 10 MG tablet Take 1 tablet (10 mg total) by mouth at bedtime. 10/24/18 10/24/19  Serafina Mitchell, MD    Family History Family History  Problem Relation Age of Onset  . Heart failure Mother     Social History Social History   Tobacco Use  . Smoking status: Former Research scientist (life sciences)  . Smokeless tobacco: Never Used  Vaping Use  . Vaping Use: Never used  Substance Use Topics  . Alcohol use: Never  .  Drug use: Never     Allergies   Erythromycin, Sulfa antibiotics, and Clindamycin/lincomycin   Review of Systems Review of Systems PER HPI    Physical Exam Triage Vital Signs ED Triage Vitals  Enc Vitals Group     BP 08/14/20 1001 123/65     Pulse Rate 08/14/20 1001 94     Resp 08/14/20 1001 18     Temp 08/14/20 1001 98.6 F (37 C)     Temp Source 08/14/20 1001 Oral     SpO2 08/14/20 1001 97 %     Weight --      Height --      Head Circumference --      Peak Flow --      Pain Score 08/14/20 0959 0     Pain Loc --      Pain Edu? --      Excl. in [place]? --    No data found.  Updated Vital Signs BP 123/65 (BP Location: Right Arm)   Pulse 94   Temp  98.6 F (37 C) (Oral)   Resp 18   SpO2 97%   Visual Acuity Right Eye Distance:   Left Eye Distance:   Bilateral Distance:    Right Eye Near:   Left Eye Near:    Bilateral Near:     Physical Exam Vitals and nursing note reviewed.  Constitutional:      Appearance: Normal appearance. She is not ill-appearing.  HENT:     Head: Atraumatic.  Eyes:     Extraocular Movements: Extraocular movements intact.     Conjunctiva/sclera: Conjunctivae normal.  Cardiovascular:     Rate and Rhythm: Normal rate and regular rhythm.     Heart sounds: Normal heart sounds.  Pulmonary:     Effort: Pulmonary effort is normal.     Breath sounds: Normal breath sounds.  Abdominal:     General: Bowel sounds are normal. There is no distension.     Palpations: Abdomen is soft.     Tenderness: There is no abdominal tenderness. There is no guarding.  Musculoskeletal:        General: No deformity. Normal range of motion.     Cervical back: Normal range of motion and neck supple.     Comments: Antalgic gait  Skin:    General: Skin is warm and dry.  Neurological:     Mental Status: She is alert and oriented to person, place, and time.  Psychiatric:        Mood and Affect: Mood normal.        Thought Content: Thought content normal.        Judgment: Judgment normal.    UC Treatments / Results  Labs (all labs ordered are listed, but only abnormal results are displayed) Labs Reviewed - No data to display  EKG   Radiology No results found.  Procedures Procedures (including critical care time)  Medications Ordered in UC Medications - No data to display  Initial Impression / Assessment and Plan / UC Course  I have reviewed the triage vital signs and the nursing notes.  Pertinent labs & imaging results that were available during my care of the patient were reviewed by me and considered in my medical decision making (see chart for details).     Vitals reassuring, exam benign, history  consistent with NV related to abx. WIll send zofran, complete course if able with that, and eat a solid meal prior to dosing. Walker script given, note  for 3 days off feet as much as able given. F/u with PCP when able.   Final Clinical Impressions(s) / UC Diagnoses   Final diagnoses:  Non-intractable vomiting with nausea, unspecified vomiting type  Medication side effect  Cellulitis of leg, left   Discharge Instructions   None    ED Prescriptions    Medication Sig Dispense Auth. Provider   Misc. Devices MISC Walker with seat Dx: M62.81 1 Units Volney American, PA-C   ondansetron (ZOFRAN ODT) 4 MG disintegrating tablet Take 1 tablet (4 mg total) by mouth every 8 (eight) hours as needed for nausea or vomiting. 30 tablet Volney American, [place]     PDMP not reviewed this encounter.   Volney American, [place] 08/14/20 1359

## 2020-08-14 NOTE — ED Triage Notes (Signed)
Pt presents with concerns to a possible reaction to a medication that was prescribed. Pt states she has been having stomach pain and vomiting when taking the medication. Pt states Wound Care Specialist told her she needs to get a prescription for a walker.

## 2020-09-11 ENCOUNTER — Encounter (HOSPITAL_BASED_OUTPATIENT_CLINIC_OR_DEPARTMENT_OTHER): Payer: Medicare Other | Admitting: Physician Assistant

## 2020-09-12 ENCOUNTER — Other Ambulatory Visit: Payer: Self-pay

## 2020-09-12 ENCOUNTER — Encounter (HOSPITAL_BASED_OUTPATIENT_CLINIC_OR_DEPARTMENT_OTHER): Payer: Medicare Other | Attending: Physician Assistant | Admitting: Physician Assistant

## 2020-09-12 ENCOUNTER — Other Ambulatory Visit (HOSPITAL_COMMUNITY): Payer: Self-pay | Admitting: Physician Assistant

## 2020-09-12 DIAGNOSIS — E11621 Type 2 diabetes mellitus with foot ulcer: Secondary | ICD-10-CM | POA: Diagnosis not present

## 2020-09-12 DIAGNOSIS — E1142 Type 2 diabetes mellitus with diabetic polyneuropathy: Secondary | ICD-10-CM | POA: Diagnosis not present

## 2020-09-12 DIAGNOSIS — I10 Essential (primary) hypertension: Secondary | ICD-10-CM | POA: Diagnosis not present

## 2020-09-12 DIAGNOSIS — E13621 Other specified diabetes mellitus with foot ulcer: Secondary | ICD-10-CM

## 2020-09-12 DIAGNOSIS — L97522 Non-pressure chronic ulcer of other part of left foot with fat layer exposed: Secondary | ICD-10-CM | POA: Diagnosis not present

## 2020-09-12 DIAGNOSIS — E1151 Type 2 diabetes mellitus with diabetic peripheral angiopathy without gangrene: Secondary | ICD-10-CM | POA: Diagnosis not present

## 2020-09-12 NOTE — Progress Notes (Signed)
ELLYCE, LAFEVERS ([id number]) Visit Report for 09/12/2020 Abuse/Suicide Risk Screen Details Patient Name: Date of Service: Bethany Zimmerman 09/12/2020 12:30 PM Medical Record Number: [id number] Patient Account Number: [id number] Date of Birth/Sex: Treating RN: Oct 31, 1970 (49 y.o. Female) Fonnie Mu Primary Care Bryona Foxworthy: PA Zenovia Jordan, NO Other Clinician: Referring Sundae Maners: Treating Srijan Givan/Extender: Joyce Copa in Treatment: 0 Abuse/Suicide Risk Screen Items Answer ABUSE RISK SCREEN: Has anyone close to you tried to hurt or harm you recentlyo No Do you feel uncomfortable with anyone in your familyo No Has anyone forced you do things that you didnt want to doo No Electronic Signature(s) Signed: 09/12/2020 6:02:38 PM By: Fonnie Mu RN Entered By: Fonnie Mu on 09/12/2020 12:49:19 -------------------------------------------------------------------------------- Activities of Daily Living Details Patient Name: Date of Service: Bethany Zimmerman 09/12/2020 12:30 PM Medical Record Number: [id number] Patient Account Number: [id number] Date of Birth/Sex: Treating RN: [birth year] (49 y.o. Female) Fonnie Mu Primary Care Makeya Hilgert: PA Zenovia Jordan, [place] Other Clinician: Referring Roshon Duell: Treating Kamiah Fite/Extender: Joyce Copa in Treatment: 0 Activities of Daily Living Items Answer Activities of Daily Living (Please select one for each item) Drive Automobile Completely Able T Medications ake Completely Able Use T elephone Completely Able Care for Appearance Completely Able Use T oilet Completely Able Bath / Shower Completely Able Dress Self Completely Able Feed Self Completely Able Walk Completely Able Get In / Out Bed Completely Able Housework Completely Able Prepare Meals Completely Able Handle Money Completely Able Shop for Self Completely Able Electronic Signature(s) Signed: 09/12/2020 6:02:38 PM By:  Fonnie Mu RN Entered By: Fonnie Mu on 09/12/2020 12:49:40 -------------------------------------------------------------------------------- Education Screening Details Patient Name: Date of Service: Bethany Zimmerman, Bethany Zimmerman 09/12/2020 12:30 PM Medical Record Number: [id number] Patient Account Number: [id number] Date of Birth/Sex: Treating RN: [birth year] (49 y.o. Female) Fonnie Mu Primary Care Abdurahman Rugg: PA Zenovia Jordan, NO Other Clinician: Referring Chalsey Leeth: Treating [place] Welcome/Extender: Joyce Copa in Treatment: 0 Primary Learner Assessed: Patient Learning Preferences/Education Level/Primary Language Learning Preference: Explanation, Demonstration, Communication Board, Printed Material Highest Education Level: College or Above Preferred Language: English Cognitive Barrier Language Barrier: No Translator Needed: No Memory Deficit: No Emotional Barrier: No Cultural/Religious Beliefs Affecting Medical Care: No Physical Barrier Impaired Vision: Yes Glasses, sometime Impaired Hearing: No Decreased Hand dexterity: No Knowledge/Comprehension Knowledge Level: High Comprehension Level: High Ability to understand written instructions: High Ability to understand verbal instructions: High Motivation Anxiety Level: Calm Cooperation: Cooperative Education Importance: Denies Need Interest in Health Problems: Asks Questions Perception: Coherent Willingness to Engage in Self-Management High Activities: Readiness to Engage in Self-Management High Activities: Electronic Signature(s) Signed: 09/12/2020 6:02:38 PM By: Fonnie Mu RN Entered By: Fonnie Mu on 09/12/2020 12:50:18 -------------------------------------------------------------------------------- Fall Risk Assessment Details Patient Name: Date of Service: Bethany Zimmerman, Bethany Zimmerman 09/12/2020 12:30 PM Medical Record Number: [id number] Patient Account Number: [id number] Date of  Birth/Sex: Treating RN: October 25, 1970 (49 y.o. Female) Fonnie Mu Primary Care Abiageal Blowe: PA Zenovia Jordan, NO Other Clinician: Referring Zenda Herskowitz: Treating Birdia Jaycox/Extender: Joyce Copa in Treatment: 0 Fall Risk Assessment Items Have you had 2 or more falls in the last 12 monthso 0 No Have you had any fall that resulted in injury in the last 12 monthso 0 No FALLS RISK SCREEN History of falling - immediate or within 3 months 0 No Secondary diagnosis (Do you have 2 or more medical diagnoseso) 0 No Ambulatory aid None/bed rest/wheelchair/nurse 0 No Crutches/cane/walker 0 No Furniture 0 No Intravenous therapy Access/Saline/Heparin [employer]  0 No Gait/Transferring Normal/ bed rest/ wheelchair 0 No Weak (short steps with or without shuffle, stooped but able to lift head while walking, may seek 0 No support from furniture) Impaired (short steps with shuffle, may have difficulty arising from chair, head down, impaired 0 No balance) Mental Status Oriented to own ability 0 No Electronic Signature(s) Signed: 09/12/2020 6:02:38 PM By: Fonnie Mu RN Entered By: Fonnie Mu on 09/12/2020 12:50:24 -------------------------------------------------------------------------------- Foot Assessment Details Patient Name: Date of Service: Bethany Zimmerman, Bethany Zimmerman 09/12/2020 12:30 PM Medical Record Number: [id number] Patient Account Number: [id number] Date of Birth/Sex: Treating RN: 08/06/1970 (49 y.o. Female) Fonnie Mu Primary Care Donelda Mailhot: PA Zenovia Jordan, NO Other Clinician: Referring Zoraida Havrilla: Treating Elara Cocke/Extender: Joyce Copa in Treatment: 0 Foot Assessment Items Site Locations + = Sensation present, - = Sensation absent, C = Callus, U = Ulcer R = Redness, W = Warmth, M = Maceration, PU = Pre-ulcerative lesion F = Fissure, S = Swelling, D = Dryness Assessment Right: Left: Other Deformity: No No Prior Foot Ulcer: No No Prior  Amputation: No No Charcot Joint: No No Ambulatory Status: Ambulatory Without Help Gait: Steady Electronic Signature(s) Signed: 09/12/2020 6:02:38 PM By: Fonnie Mu RN Entered By: Fonnie Mu on 09/12/2020 13:14:07 -------------------------------------------------------------------------------- Nutrition Risk Screening Details Patient Name: Date of Service: Bethany Zimmerman 09/12/2020 12:30 PM Medical Record Number: [id number] Patient Account Number: [id number] Date of Birth/Sex: Treating RN: Oct 27, 1970 (49 y.o. Female) Fonnie Mu Primary Care Lennin Osmond: PA Zenovia Jordan, NO Other Clinician: Referring Marykate Heuberger: Treating Demetrious Rainford/Extender: Joyce Copa in Treatment: 0 Height (in): 67 Weight (lbs): 198 Body Mass Index (BMI): 31 Nutrition Risk Screening Items Score Screening NUTRITION RISK SCREEN: I have an illness or condition that made me change the kind and/or amount of food I eat 0 No I eat fewer than two meals per day 0 No I eat few fruits and vegetables, or milk products 0 No I have three or more drinks of beer, liquor or wine almost every day 0 No I have tooth or mouth problems that make it hard for me to eat 0 No I don't always have enough money to buy the food I need 0 No I eat alone most of the time 0 No I take three or more different prescribed or over-the-counter drugs Bethany day 0 No Without wanting to, I have lost or gained 10 pounds in the last six months 0 No I am not always physically able to shop, cook and/or feed myself 0 No Nutrition Protocols Good Risk Protocol 0 No interventions needed Moderate Risk Protocol High Risk Proctocol Risk Level: Good Risk Score: 0 Electronic Signature(s) Signed: 09/12/2020 6:02:38 PM By: Fonnie Mu RN Entered By: Fonnie Mu on 09/12/2020 12:50:31

## 2020-09-12 NOTE — Progress Notes (Signed)
KRUPA, STEGE ([id number]) Visit Report for 09/12/2020 Chief Complaint Document Details Patient Name: Date of Service: Bethany Zimmerman 09/12/2020 12:30 PM Medical Record Number: [id number] Patient Account Number: [id number] Date of Birth/Sex: Treating RN: 03/07/1971 (50 y.o. Female) Deon Pilling Primary Care Provider: PA Haig Prophet, [place] Other Clinician: Referring Provider: Treating Provider/Extender: Princess Perna in Treatment: 0 Information Obtained from: Patient Chief Complaint Left foot ulcer Electronic Signature(s) Signed: 09/12/2020 1:48:03 PM By: Worthy Keeler PA-C Entered By: Worthy Keeler on 09/12/2020 13:48:02 -------------------------------------------------------------------------------- Debridement Details Patient Name: Date of Service: Bethany Zimmerman, [name] 09/12/2020 12:30 PM Medical Record Number: [id number] Patient Account Number: [id number] Date of Birth/Sex: Treating RN: 1970-08-09 (50 y.o. Female) Deon Pilling Primary Care Provider: PA Haig Prophet, [place] Other Clinician: Referring Provider: Treating Provider/Extender: Princess Perna in Treatment: 0 Debridement Performed for Assessment: Wound #1 Left,Plantar Metatarsal head fifth Performed By: Physician Worthy Keeler, PA Debridement Type: Debridement Severity of Tissue Pre Debridement: Fat layer exposed Level of Consciousness (Pre-procedure): Awake and Alert Pre-procedure Verification/Time Out Yes - 13:50 Taken: Start Time: 13:51 Pain Control: Lidocaine 4% T opical Solution T Area Debrided (L x W): otal 1 (cm) x 1 (cm) = 1 (cm) Tissue and other material debrided: Viable, Non-Viable, Callus, Skin: Dermis , Skin: Epidermis, Fibrin/Exudate Level: Skin/Epidermis Debridement Description: Selective/Open Wound Instrument: Curette Bleeding: Minimum Hemostasis Achieved: Pressure End Time: 13:56 Procedural Pain: 1 Post Procedural Pain: 3 Response to Treatment: Procedure  was tolerated well Level of Consciousness (Post- Awake and Alert procedure): Post Debridement Measurements of Total Wound Length: (cm) 0.1 Width: (cm) 0.1 Depth: (cm) 0.1 Volume: (cm) 0.001 Character of Wound/Ulcer Post Debridement: Improved Severity of Tissue Post Debridement: Fat layer exposed Post Procedure Diagnosis Same as Pre-procedure Electronic Signature(s) Signed: 09/12/2020 5:31:07 PM By: Worthy Keeler PA-C Signed: 09/12/2020 5:49:32 PM By: Deon Pilling Entered By: Deon Pilling on 09/12/2020 13:56:21 -------------------------------------------------------------------------------- HPI Details Patient Name: Date of Service: Bethany Zimmerman, [name] 09/12/2020 12:30 PM Medical Record Number: [id number] Patient Account Number: [id number] Date of Birth/Sex: Treating RN: 07-13-1971 (50 y.o. Female) Deon Pilling Primary Care Provider: PA Haig Prophet, [place] Other Clinician: Referring Provider: Treating Provider/Extender: Princess Perna in Treatment: 0 History of Present Illness HPI Description: 09/12/2020 upon evaluation today patient appears to be doing somewhat poorly in regard to her left foot. She has a wound that has been present really since around October 2021 when her grandson ran over her foot with a toy car. Subsequently she tells me that around Thanksgiving she had cellulitis which she does not open and started draining and subsequently then healed after some antibiotic therapy. Again in December this reopened and she has been on doxycycline in December, January, and February. She was using manuka honey strips over this but then was told by the provider not to put anything on it that it needed to be in good "dry out". In 2016 she did have her first bout with peripheral vascular disease she is unfortunately had some issues here and she tells me that unfortunately when she last saw vascular they told her that they were somewhat reluctant to go in and do any type of  work on her left leg she tells me due to the fact that she did not actually respond well to work done on her right leg. Nonetheless I am not certain if the ABI we got today is completely accurate but if it is then she may  definitely need something in order to get this to heal effectively. She does have history of diabetes mellitus type 2, peripheral neuropathy though she tells me that the foot does hurt where this wound is. She has hypertension as well as again peripheral vascular disease. Fortunately there is no signs of obvious infection at this point. Electronic Signature(s) Signed: 09/12/2020 2:05:40 PM By: Worthy Keeler PA-C Entered By: Worthy Keeler on 09/12/2020 14:05:40 -------------------------------------------------------------------------------- Physical Exam Details Patient Name: Date of Service: Bethany Zimmerman NGELA 09/12/2020 12:30 PM Medical Record Number: [id number] Patient Account Number: [id number] Date of Birth/Sex: Treating RN: 1971/02/08 (50 y.o. Female) Deon Pilling Primary Care Provider: PA Haig Prophet, [place] Other Clinician: Referring Provider: Treating Provider/Extender: Princess Perna in Treatment: 0 Constitutional sitting or standing blood pressure is within target range for patient.. pulse regular and within target range for patient.[name] respirations regular, non-labored and within target range for patient.[name] temperature within target range for patient.. Well-nourished and well-hydrated in no acute distress. Eyes conjunctiva clear no eyelid edema noted. pupils equal round and reactive to light and accommodation. Ears, Nose, Mouth, and Throat no gross abnormality of ear auricles or external auditory canals. normal hearing noted during conversation. mucus membranes moist. Respiratory normal breathing without difficulty. Cardiovascular 2+ dorsalis pedis/posterior tibialis pulses. no clubbing, cyanosis, significant edema, <3 sec cap  refill. Musculoskeletal normal gait and posture. no significant deformity or arthritic changes, no loss or range of motion, no clubbing. Psychiatric this patient is able to make decisions and demonstrates good insight into disease process. Alert and Oriented x 3. pleasant and cooperative. Notes Upon inspection patient's wound bed actually showed signs of good granulation at this time in the center of the wound there was really no significant slough there was a lot of callus buildup and I did actually perform sharp debridement to clear away the callus around the edges of the wound she tolerated that today without complication post debridement wound bed appears to be doing much better which is great news. There does not appear to be any signs of active infection which is also good news. Nonetheless though this is a very tiny wound the fact that it has been recurrent over the past several months and she had a negative x-ray makes me think that we should probably go ahead for with an MRI in order to ensure that there was nothing else showing up here. Her x-ray was actually on 06/25/2020 and again was negative for any obvious signs of osteomyelitis. There was however a questionable undisplaced fracture at the base of the fifth proximal phalanx. I do count a question whether or not that could be part of the problem here. Electronic Signature(s) Signed: 09/12/2020 2:06:51 PM By: Worthy Keeler PA-C Entered By: Worthy Keeler on 09/12/2020 14:06:51 -------------------------------------------------------------------------------- Physician Orders Details Patient Name: Date of Service: Bethany Zimmerman NGELA 09/12/2020 12:30 PM Medical Record Number: [id number] Patient Account Number: [id number] Date of Birth/Sex: Treating RN: October 07, 1970 (50 y.o. Female) Deon Pilling Primary Care Provider: PA Haig Prophet, [place] Other Clinician: Referring Provider: Treating Provider/Extender: Princess Perna  in Treatment: 0 Verbal / Phone Orders: No Diagnosis Coding ICD-10 Coding Code Description E11.621 Type 2 diabetes mellitus with foot ulcer L97.522 Non-pressure chronic ulcer of other part of left foot with fat layer exposed E11.40 Type 2 diabetes mellitus with diabetic neuropathy, unspecified I10 Essential (primary) hypertension I73.89 Other specified peripheral vascular diseases Follow-up Appointments Return Appointment in 1 week. Bathing/ Shower/  Hygiene May shower and wash wound with soap and water. Edema Control - Lymphedema / SCD / Other Elevate legs to the level of the heart or above for 30 minutes daily and/or when sitting, a frequency of: - throughout the day. Avoid standing for long periods of time. Moisturize legs daily. Off-Loading Wedge shoe to: - wear while walking and standing. Wound Treatment Wound #1 - Metatarsal head fifth Wound Laterality: Plantar, Left Cleanser: Soap and Water Every Other Day/30 Days Discharge Instructions: May shower and wash wound with dial antibacterial soap and water prior to dressing change. Cleanser: Byram Ancillary Kit - 15 Day Supply (DME) (Generic) Every Other Day/30 Days Discharge Instructions: Use supplies as instructed; Kit contains: (15) Saline Bullets; (15) 3x3 Gauze; 15 pr Gloves Prim Dressing: Xeroform Occlusive Gauze Dressing, 4x4 in (DME) (Generic) Every Other Day/30 Days ary Discharge Instructions: Apply to wound bed as instructed Secondary Dressing: ComfortFoam Border, 4x4 in (silicone border) (DME) (Generic) Every Other Day/30 Days Discharge Instructions: Apply over primary dressing as directed. Radiology MRI, lower extremity with and without contrast left foot - MRI with and without contrast of left foot related to non-healing diabetic foot ulcer. CPT code - (ICD10 E11.621 - Type 2 diabetes mellitus with foot ulcer) Services and Therapies rterial Studies- Bilateral with TBIs - Arterial studies with ABIs and TBIs related to  history of arterial insufficiency. - (ICD10 I73.89 - Other specified A peripheral vascular diseases) Electronic Signature(s) Signed: 09/12/2020 5:31:07 PM By: Worthy Keeler PA-C Signed: 09/12/2020 5:49:32 PM By: Deon Pilling Entered By: Deon Pilling on 09/12/2020 13:59:18 Prescription 09/12/2020 -------------------------------------------------------------------------------- Dory Peru PA Patient Name: Provider: July 19, 1970 [id number] Date of Birth: NPI#: Female [id number] Sex: DEA #: [phone] Phone #: License #: [place] Patient Address: [facility] [address] [place], [place] [address] [place], [place] [address] [phone] Allergies azithromycin; Sulfa (Sulfonamide Antibiotics); Corticosteroids (Glucocorticoids); bee sting kit Provider's Orders MRI, lower extremity with and without contrast left foot - ICD10: E11.621 - MRI with and without contrast of left foot related to non-healing diabetic foot ulcer. CPT code Hand Signature: Date(s): Prescription 09/12/2020 Dory Peru PA Patient Name: Provider: [birth year] [id number] Date of Birth: NPI#: Female [id number] Sex: DEA #: [phone] Phone #: License #: [place] Patient Address: [facility] [address] [place], [place] [address] [place], [place] [address] [phone] Allergies azithromycin; Sulfa (Sulfonamide Antibiotics); Corticosteroids (Glucocorticoids); bee sting kit Provider's Orders rterial Studies- Bilateral with TBIs - ICD10: I73.89 - Arterial studies with ABIs and TBIs related to history of arterial insufficiency. A Hand Signature: Date(s): Electronic Signature(s) Signed: 09/12/2020 5:31:07 PM By: Worthy Keeler PA-C Signed: 09/12/2020 5:49:32 PM By: Deon Pilling Entered By: Deon Pilling on 09/12/2020  13:59:18 -------------------------------------------------------------------------------- Problem List Details Patient Name: Date of Service: Bethany Zimmerman, [name] 09/12/2020 12:30 PM Medical Record Number: [id number] Patient Account Number: [id number] Date of Birth/Sex: Treating RN: 06/22/1971 (50 y.o. Female) Deon Pilling Primary Care Provider: PA Haig Prophet, [place] Other Clinician: Referring Provider: Treating Provider/Extender: Princess Perna in Treatment: 0 Active Problems ICD-10 Encounter Code Description Active Date MDM Diagnosis E11.621 Type 2 diabetes mellitus with foot ulcer 09/12/2020 No Yes L97.522 Non-pressure chronic ulcer of other part of left foot with fat layer exposed 09/12/2020 No Yes E11.40 Type 2 diabetes mellitus with diabetic neuropathy, unspecified 09/12/2020 No Yes I10 Essential (primary) hypertension 09/12/2020  No Yes I73.89 Other specified peripheral vascular diseases 09/12/2020 No Yes Inactive Problems Resolved Problems Electronic Signature(s) Signed: 09/12/2020 1:47:46 PM By: Worthy Keeler PA-C Entered By: Worthy Keeler on 09/12/2020 13:47:46 -------------------------------------------------------------------------------- Progress Note Details Patient Name: Date of Service: Bethany Zimmerman NGELA 09/12/2020 12:30 PM Medical Record Number: [id number] Patient Account Number: [id number] Date of Birth/Sex: Treating RN: 09/04/1970 (50 y.o. Female) Deon Pilling Primary Care Provider: PA Haig Prophet, [place] Other Clinician: Referring Provider: Treating Provider/Extender: Princess Perna in Treatment: 0 Subjective Chief Complaint Information obtained from Patient Left foot ulcer History of Present Illness (HPI) 09/12/2020 upon evaluation today patient appears to be doing somewhat poorly in regard to her left foot. She has a wound that has been present really since around October 2021 when her grandson ran over her foot with a toy car.  Subsequently she tells me that around Thanksgiving she had cellulitis which she does not open and started draining and subsequently then healed after some antibiotic therapy. Again in December this reopened and she has been on doxycycline in December, January, and February. She was using manuka honey strips over this but then was told by the provider not to put anything on it that it needed to be in good "dry out". In 2016 she did have her first bout with peripheral vascular disease she is unfortunately had some issues here and she tells me that unfortunately when she last saw vascular they told her that they were somewhat reluctant to go in and do any type of work on her left leg she tells me due to the fact that she did not actually respond well to work done on her right leg. Nonetheless I am not certain if the ABI we got today is completely accurate but if it is then she may definitely need something in order to get this to heal effectively. She does have history of diabetes mellitus type 2, peripheral neuropathy though she tells me that the foot does hurt where this wound is. She has hypertension as well as again peripheral vascular disease. Fortunately there is no signs of obvious infection at this point. Patient History Information obtained from Patient. Allergies azithromycin, Sulfa (Sulfonamide Antibiotics), Corticosteroids (Glucocorticoids), bee sting kit Family History Cancer - Father, Diabetes - Father, Heart Disease - Mother, Hypertension - Mother, No family history of Hereditary Spherocytosis, Kidney Disease, Lung Disease, Seizures, Stroke, Thyroid Problems, Tuberculosis. Social History Former smoker, Marital Status - Married, Alcohol Use - Never, Drug Use - No History, Caffeine Use - Moderate. Medical History Eyes Denies history of Cataracts, Glaucoma, Optic Neuritis Ear/Nose/Mouth/Throat Denies history of Chronic sinus problems/congestion, Middle ear  problems Hematologic/Lymphatic Denies history of Anemia, Hemophilia, Human Immunodeficiency Virus, Lymphedema, Sickle Cell Disease Respiratory Denies history of Aspiration, Asthma, Chronic Obstructive Pulmonary Disease (COPD), Pneumothorax, Sleep Apnea, Tuberculosis Cardiovascular Patient has history of Hypertension Denies history of Angina, Arrhythmia, Congestive Heart Failure, Coronary Artery Disease, Deep Vein Thrombosis, Hypotension, Myocardial Infarction, Peripheral Arterial Disease, Peripheral Venous Disease, Phlebitis, Vasculitis Gastrointestinal Patient has history of Colitis Denies history of Cirrhosis , Crohnoos, Hepatitis A, Hepatitis B, Hepatitis C Endocrine Patient has history of Type II Diabetes Denies history of Type I Diabetes Genitourinary Denies history of End Stage Renal Disease Immunological Denies history of Lupus Erythematosus, Raynaudoos, Scleroderma Integumentary (Skin) Denies history of History of Burn Musculoskeletal Denies history of Gout, Rheumatoid Arthritis, Osteoarthritis, Osteomyelitis Neurologic Patient has history of Neuropathy Denies history of Dementia, Quadriplegia, Paraplegia, Seizure Disorder Oncologic Denies history of Received  Chemotherapy, Received Radiation Psychiatric Denies history of Anorexia/bulimia, Confinement Anxiety Patient is treated with Controlled Diet, Oral Agents. Blood sugar is tested. Hospitalization/Surgery History - right leg surgery. - back surgery x 3. - cholecystectomy. - C-section x 3. - Appendectomy. Medical A Surgical History Notes nd Cardiovascular hx of vascular insufficiency, hx of arthropathy Gastrointestinal hx of ulcerative colitis, hx of reflux esophagitis, hypercholesterolemia Musculoskeletal hx of polyaarthralgia Neurologic spondylolisthesis Psychiatric anxiety Review of Systems (ROS) Constitutional Symptoms (General Health) Denies complaints or symptoms of Fatigue, Fever, Chills, Marked Weight  Change. Eyes Denies complaints or symptoms of Dry Eyes, Vision Changes, Glasses / Contacts. Ear/Nose/Mouth/Throat Denies complaints or symptoms of Chronic sinus problems or rhinitis. Respiratory Denies complaints or symptoms of Chronic or frequent coughs, Shortness of Breath. Cardiovascular Denies complaints or symptoms of Chest pain. Gastrointestinal Denies complaints or symptoms of Frequent diarrhea, Nausea, Vomiting. Endocrine Denies complaints or symptoms of Heat/cold intolerance. Genitourinary Denies complaints or symptoms of Frequent urination. Integumentary (Skin) Complains or has symptoms of Wounds - right plantar. Musculoskeletal Denies complaints or symptoms of Muscle Pain, Muscle Weakness. Neurologic Denies complaints or symptoms of Numbness/parasthesias. Psychiatric Denies complaints or symptoms of Claustrophobia, Suicidal. Objective Constitutional sitting or standing blood pressure is within target range for patient.. pulse regular and within target range for patient.[name] respirations regular, non-labored and within target range for patient.[name] temperature within target range for patient.. Well-nourished and well-hydrated in no acute distress. Vitals Time Taken: 12:47 PM, Height: 67 in, Source: Stated, Weight: 198 lbs, Source: Stated, BMI: 31, Temperature: 98.3 F, Pulse: 103 bpm, Respiratory Rate: 17 breaths/min, Blood Pressure: 133/86 mmHg, Capillary Blood Glucose: 94 mg/dl. Eyes conjunctiva clear no eyelid edema noted. pupils equal round and reactive to light and accommodation. Ears, Nose, Mouth, and Throat no gross abnormality of ear auricles or external auditory canals. normal hearing noted during conversation. mucus membranes moist. Respiratory normal breathing without difficulty. Cardiovascular 2+ dorsalis pedis/posterior tibialis pulses. no clubbing, cyanosis, significant edema, Musculoskeletal normal gait and posture. no significant deformity or arthritic  changes, no loss or range of motion, no clubbing. Psychiatric this patient is able to make decisions and demonstrates good insight into disease process. Alert and Oriented x 3. pleasant and cooperative. General Notes: Upon inspection patient's wound bed actually showed signs of good granulation at this time in the center of the wound there was really no significant slough there was a lot of callus buildup and I did actually perform sharp debridement to clear away the callus around the edges of the wound she tolerated that today without complication post debridement wound bed appears to be doing much better which is great news. There does not appear to be any signs of active infection which is also good news. Nonetheless though this is a very tiny wound the fact that it has been recurrent over the past several months and she had a negative x-ray makes me think that we should probably go ahead for with an MRI in order to ensure that there was nothing else showing up here. Her x-ray was actually on 06/25/2020 and again was negative for any obvious signs of osteomyelitis. There was however a questionable undisplaced fracture at the base of the fifth proximal phalanx. I do count a question whether or not that could be part of the problem here. Integumentary (Hair, Skin) Wound #1 status is Open. Original cause of wound was Trauma. The date acquired was: 04/29/2020. The wound is located on the Left,Plantar Metatarsal head fifth. The wound measures 0.1cm length x 0.1cm  width x 0.4cm depth; 0.008cm^2 area and 0.003cm^3 volume. There is no tunneling noted, however, there is undermining starting at 12:00 and ending at 12:00 with a maximum distance of 0.4cm. There is a medium amount of serosanguineous drainage noted. The wound margin is distinct with the outline attached to the wound base. There is large (67-100%) red, pink granulation within the wound bed. There is no necrotic tissue within the wound  bed. Assessment Active Problems ICD-10 Type 2 diabetes mellitus with foot ulcer Non-pressure chronic ulcer of other part of left foot with fat layer exposed Type 2 diabetes mellitus with diabetic neuropathy, unspecified Essential (primary) hypertension Other specified peripheral vascular diseases Procedures Wound #1 Pre-procedure diagnosis of Wound #1 is a Diabetic Wound/Ulcer of the Lower Extremity located on the Left,Plantar Metatarsal head fifth .Severity of Tissue Pre Debridement is: Fat layer exposed. There was a Selective/Open Wound Skin/Epidermis Debridement with a total area of 1 sq cm performed by Worthy Keeler, PA. With the following instrument(s): Curette to remove Viable and Non-Viable tissue/material. Material removed includes Callus, Skin: Dermis, Skin: Epidermis, and Fibrin/Exudate after achieving pain control using Lidocaine 4% Topical Solution. A time out was conducted at 13:50, prior to the start of the procedure. A Minimum amount of bleeding was controlled with Pressure. The procedure was tolerated well with a pain level of 1 throughout and a pain level of 3 following the procedure. Post Debridement Measurements: 0.1cm length x 0.1cm width x 0.1cm depth; 0.001cm^3 volume. Character of Wound/Ulcer Post Debridement is improved. Severity of Tissue Post Debridement is: Fat layer exposed. Post procedure Diagnosis Wound #1: Same as Pre-Procedure Plan Follow-up Appointments: Return Appointment in 1 week. Bathing/ Shower/ Hygiene: May shower and wash wound with soap and water. Edema Control - Lymphedema / SCD / Other: Elevate legs to the level of the heart or above for 30 minutes daily and/or when sitting, a frequency of: - throughout the day. Avoid standing for long periods of time. Moisturize legs daily. Off-Loading: Wedge shoe to: - wear while walking and standing. Radiology ordered were: MRI, lower extremity with and without contrast left foot - MRI with and without  contrast of left foot related to non-healing diabetic foot ulcer. CPT code Services and Therapies ordered were: Arterial Studies- Bilateral with TBIs - Arterial studies with ABIs and TBIs related to history of arterial insufficiency. WOUND #1: - Metatarsal head fifth Wound Laterality: Plantar, Left Cleanser: Soap and Water Every Other Day/30 Days Discharge Instructions: May shower and wash wound with dial antibacterial soap and water prior to dressing change. Cleanser: Byram Ancillary Kit - 15 Day Supply (DME) (Generic) Every Other Day/30 Days Discharge Instructions: Use supplies as instructed; Kit contains: (15) Saline Bullets; (15) 3x3 Gauze; 15 pr Gloves Prim Dressing: Xeroform Occlusive Gauze Dressing, 4x4 in (DME) (Generic) Every Other Day/30 Days ary Discharge Instructions: Apply to wound bed as instructed Secondary Dressing: ComfortFoam Border, 4x4 in (silicone border) (DME) (Generic) Every Other Day/30 Days Discharge Instructions: Apply over primary dressing as directed. 1. Would recommend currently that we going continue with the wound care measures as before hoops #1 would recommend currently that we go ahead and initiate treatment with Xeroform gauze dressing. I think this is probably the best way to go to keep this moist so she does not develop too much thick callus. 2. Also can recommend at this time that we use a border foam dressing to cover. 3. I am also can I suggest that she change this at least every other day.  She tells me she will probably be changing it daily due to the fact that she does shower daily. 4. I am also can recommend a front offloading shoe to try to help keep pressure off of this area so hopefully she will not have as much in the way of pain. We will see patient back for reevaluation in 2 weeks here in the clinic. If anything worsens or changes patient will contact our office for additional recommendations. Electronic Signature(s) Signed: 09/12/2020 2:07:35 PM  By: Worthy Keeler PA-C Entered By: Worthy Keeler on 09/12/2020 14:07:35 -------------------------------------------------------------------------------- HxROS Details Patient Name: Date of Service: Bethany Zimmerman, [name] 09/12/2020 12:30 PM Medical Record Number: [id number] Patient Account Number: [id number] Date of Birth/Sex: Treating RN: [birth year] (50 y.o. Female) Bethany Zimmerman Primary Care Provider: PA Haig Prophet, [place] Other Clinician: Referring Provider: Treating Provider/Extender: Princess Perna in Treatment: 0 Information Obtained From Patient Constitutional Symptoms (General Health) Complaints and Symptoms: Negative for: Fatigue; Fever; Chills; Marked Weight Change Eyes Complaints and Symptoms: Negative for: Dry Eyes; Vision Changes; Glasses / Contacts Medical History: Negative for: Cataracts; Glaucoma; Optic Neuritis Ear/Nose/Mouth/Throat Complaints and Symptoms: Negative for: Chronic sinus problems or rhinitis Medical History: Negative for: Chronic sinus problems/congestion; Middle ear problems Respiratory Complaints and Symptoms: Negative for: Chronic or frequent coughs; Shortness of Breath Medical History: Negative for: Aspiration; Asthma; Chronic Obstructive Pulmonary Disease (COPD); Pneumothorax; Sleep Apnea; Tuberculosis Cardiovascular Complaints and Symptoms: Negative for: Chest pain Medical History: Positive for: Hypertension Negative for: Angina; Arrhythmia; Congestive Heart Failure; Coronary Artery Disease; Deep Vein Thrombosis; Hypotension; Myocardial Infarction; Peripheral Arterial Disease; Peripheral Venous Disease; Phlebitis; Vasculitis Past Medical History Notes: hx of vascular insufficiency, hx of arthropathy Gastrointestinal Complaints and Symptoms: Negative for: Frequent diarrhea; Nausea; Vomiting Medical History: Positive for: Colitis Negative for: Cirrhosis ; Crohns; Hepatitis A; Hepatitis B; Hepatitis C Past Medical  History Notes: hx of ulcerative colitis, hx of reflux esophagitis, hypercholesterolemia Endocrine Complaints and Symptoms: Negative for: Heat/cold intolerance Medical History: Positive for: Type II Diabetes Negative for: Type I Diabetes Time with diabetes: 12 Treated with: Oral agents, Diet Blood sugar tested every day: Yes Tested : 3x Genitourinary Complaints and Symptoms: Negative for: Frequent urination Medical History: Negative for: End Stage Renal Disease Integumentary (Skin) Complaints and Symptoms: Positive for: Wounds - right plantar Medical History: Negative for: History of Burn Musculoskeletal Complaints and Symptoms: Negative for: Muscle Pain; Muscle Weakness Medical History: Negative for: Gout; Rheumatoid Arthritis; Osteoarthritis; Osteomyelitis Past Medical History Notes: hx of polyaarthralgia Neurologic Complaints and Symptoms: Negative for: Numbness/parasthesias Medical History: Positive for: Neuropathy Negative for: Dementia; Quadriplegia; Paraplegia; Seizure Disorder Past Medical History Notes: spondylolisthesis Psychiatric Complaints and Symptoms: Negative for: Claustrophobia; Suicidal Medical History: Negative for: Anorexia/bulimia; Confinement Anxiety Past Medical History Notes: anxiety Hematologic/Lymphatic Medical History: Negative for: Anemia; Hemophilia; Human Immunodeficiency Virus; Lymphedema; Sickle Cell Disease Immunological Medical History: Negative for: Lupus Erythematosus; Raynauds; Scleroderma Oncologic Medical History: Negative for: Received Chemotherapy; Received Radiation Immunizations Pneumococcal Vaccine: Received Pneumococcal Vaccination: No Implantable Devices None Hospitalization / Surgery History Type of Hospitalization/Surgery right leg surgery back surgery x 3 cholecystectomy C-section x 3 Appendectomy Family and Social History Cancer: Yes - Father; Diabetes: Yes - Father; Heart Disease: Yes - Mother;  Hereditary Spherocytosis: No; Hypertension: Yes - Mother; Kidney Disease: No; Lung Disease: No; Seizures: No; Stroke: No; Thyroid Problems: No; Tuberculosis: No; Former smoker; Marital Status - Married; Alcohol Use: Never; Drug Use: No History; Caffeine Use: Moderate; Financial Concerns: Yes; Food, Clothing or Shelter Needs: No;  Support System Lacking: No; Transportation Concerns: No Electronic Signature(s) Signed: 09/12/2020 5:31:07 PM By: Worthy Keeler PA-C Signed: 09/12/2020 6:02:38 PM By: Bethany Hammock RN Entered By: Bethany Zimmerman on 09/12/2020 13:08:37 -------------------------------------------------------------------------------- SuperBill Details Patient Name: Date of Service: Bethany Zimmerman, [name] 09/12/2020 Medical Record Number: [id number] Patient Account Number: [id number] Date of Birth/Sex: Treating RN: 09-15-1970 (50 y.o. Female) Deon Pilling Primary Care Provider: PA Haig Prophet, [place] Other Clinician: Referring Provider: Treating Provider/Extender: Princess Perna in Treatment: 0 Diagnosis Coding ICD-10 Codes Code Description E11.621 Type 2 diabetes mellitus with foot ulcer L97.522 Non-pressure chronic ulcer of other part of left foot with fat layer exposed E11.40 Type 2 diabetes mellitus with diabetic neuropathy, unspecified I10 Essential (primary) hypertension I73.89 Other specified peripheral vascular diseases Facility Procedures CPT4 Code: 50388828 Description: 99213 - WOUND CARE VISIT-LEV 3 EST PT Modifier: Quantity: 1 CPT4 Code: 00349179 Description: 15056 - DEBRIDE WOUND 1ST 20 SQ CM OR < ICD-10 Diagnosis Description L97.522 Non-pressure chronic ulcer of other part of left foot with fat layer exposed Modifier: Quantity: 1 Physician Procedures : CPT4 Code Description Modifier [id number] 65537 - WC PHYS LEVEL 4 - NEW PT 25 ICD-10 Diagnosis Description E11.621 Type 2 diabetes mellitus with foot ulcer E11.40 Type 2 diabetes mellitus with  diabetic neuropathy, unspecified L97.522 Non-pressure chronic  ulcer of other part of left foot with fat layer exposed I10 Essential (primary) hypertension Quantity: 1 : [id number] 67544 - WC PHYS DEBR WO ANESTH 20 SQ CM ICD-10 Diagnosis Description L97.522 Non-pressure chronic ulcer of other part of left foot with fat layer exposed Quantity: 1 Electronic Signature(s) Signed: 09/12/2020 5:31:07 PM By: Worthy Keeler PA-C Signed: 09/12/2020 5:49:32 PM By: Deon Pilling Previous Signature: 09/12/2020 2:07:52 PM Version By: Worthy Keeler PA-C Entered By: Deon Pilling on 09/12/2020 15:44:32

## 2020-09-12 NOTE — Progress Notes (Signed)
Bethany Zimmerman, Bethany Zimmerman ([id number]) Visit Report for 09/12/2020 Allergy List Details Patient Name: Date of Service: Bethany Zimmerman 09/12/2020 12:30 PM Medical Record Number: [id number] Patient Account Number: [id number] Date of Birth/Sex: Treating RN: 09-08-1970 (50 y.o. Female) Rhae Hammock Primary Care Kaidance Pantoja: PA Haig Prophet, [place] Other Clinician: Referring Smokey Melott: Treating Jamarious Febo/Extender: Princess Perna in Treatment: 0 Allergies Active Allergies azithromycin Sulfa (Sulfonamide Antibiotics) Corticosteroids (Glucocorticoids) bee sting kit Allergy Notes Electronic Signature(s) Signed: 09/12/2020 6:02:38 PM By: Rhae Hammock RN Entered By: Rhae Hammock on 09/12/2020 12:49:00 -------------------------------------------------------------------------------- Arrival Information Details Patient Name: Date of Service: Bethany Zimmerman, Bethany Zimmerman 09/12/2020 12:30 PM Medical Record Number: [id number] Patient Account Number: [id number] Date of Birth/Sex: Treating RN: 08-03-1970 (50 y.o. Female) Rhae Hammock Primary Care Harel Repetto: PA Haig Prophet, [place] Other Clinician: Referring Shama Monfils: Treating Laymon Stockert/Extender: Princess Perna in Treatment: 0 Visit Information Patient Arrived: Ambulatory Arrival Time: 12:46 Accompanied By: self Transfer Assistance: None Patient Identification Verified: Yes Secondary Verification Process Completed: Yes Patient Requires Transmission-Based Precautions: No Patient Has Alerts: No Electronic Signature(s) Signed: 09/12/2020 6:02:38 PM By: Rhae Hammock RN Entered By: Rhae Hammock on 09/12/2020 12:46:55 -------------------------------------------------------------------------------- Clinic Level of Care Assessment Details Patient Name: Date of Service: Bethany Zimmerman 09/12/2020 12:30 PM Medical Record Number: [id number] Patient Account Number: [id number] Date of Birth/Sex: Treating RN: 1971-05-25 (50  y.o. Female) Deon Pilling Primary Care Tabria Steines: PA Haig Prophet, [place] Other Clinician: Referring Saroya Riccobono: Treating Lorayne Getchell/Extender: Princess Perna in Treatment: 0 Clinic Level of Care Assessment Items TOOL 1 Quantity Score X- 1 0 Use when EandM and Procedure is performed on INITIAL visit ASSESSMENTS - Nursing Assessment / Reassessment X- 1 20 General Physical Exam (combine w/ comprehensive assessment (listed just below) when performed on new pt. evals) X- 1 25 Comprehensive Assessment (HX, ROS, Risk Assessments, Wounds Hx, etc.) ASSESSMENTS - Wound and Skin Assessment / Reassessment _0  - 0 Dermatologic / Skin Assessment (not related to wound area) ASSESSMENTS - Ostomy and/or Continence Assessment and Care _1  - 0 Incontinence Assessment and Management _2  - 0 Ostomy Care Assessment and Management (repouching, etc.) PROCESS - Coordination of Care X - Simple Patient / Family Education for ongoing care 1 15 _3  - 0 Complex (extensive) Patient / Family Education for ongoing care X- 1 10 Staff obtains Programmer, systems, Records, T Results / Process Orders est _4  - 0 Staff telephones HHA, Nursing Homes / Clarify orders / etc _5  - 0 Routine Transfer to another Facility (non-emergent condition) _6  - 0 Routine Hospital Admission (non-emergent condition) X- 1 15 New Admissions / Biomedical engineer / Ordering NPWT Apligraf, etc. , _7  - 0 Emergency Hospital Admission (emergent condition) PROCESS - Special Needs _8  - 0 Pediatric / Minor Patient Management _9  - 0 Isolation Patient Management _10  - 0 Hearing / Language / Visual special needs _11  - 0 Assessment of Community assistance (transportation, D/C planning, etc.) _12  - 0 Additional assistance / Altered mentation _13  - 0 Support Surface(s) Assessment (bed, cushion, seat, etc.) INTERVENTIONS - Miscellaneous _14  - 0 External ear exam _15  - 0 Patient Transfer (multiple staff / Civil Service fast streamer / Similar devices) _16  -  0 Simple Staple / Suture removal (25 or less) _17  - 0 Complex Staple / Suture removal (26 or more) _18  - 0 Hypo/Hyperglycemic Management (do not check if billed separately) X- 1 15 Ankle / Brachial Index (ABI) - do not check if billed separately Has the patient been seen at the hospital within the last  three years: Yes Total Score: 100 Level Of Care: New/Established - Level 3 Electronic Signature(s) Signed: 09/12/2020 5:49:32 PM By: Deon Pilling Signed: 09/12/2020 5:49:32 PM By: Deon Pilling Entered By: Deon Pilling on 09/12/2020 15:44:22 -------------------------------------------------------------------------------- Lower Extremity Assessment Details Patient Name: Date of Service: Bethany Zimmerman, Bethany Zimmerman 09/12/2020 12:30 PM Medical Record Number: [id number] Patient Account Number: [id number] Date of Birth/Sex: Treating RN: 1971/05/18 (50 y.o. Female) Rhae Hammock Primary Care Rieley Khalsa: PA Haig Prophet, [place] Other Clinician: Referring Chin Wachter: Treating Naomy Esham/Extender: Princess Perna in Treatment: 0 Edema Assessment Assessed: [Left: Yes] [Right: No] Edema: [Left: N] [Right: o] Calf Left: Right: Point of Measurement: 33 cm From Medial Instep 33 cm Ankle Left: Right: Point of Measurement: 10 cm From Medial Instep 22 cm Knee To Floor Left: Right: From Medial Instep 42 cm Vascular Assessment Pulses: Dorsalis Pedis Palpable: [Left:Yes] Posterior Tibial Palpable: [Left:Yes] Blood Pressure: Brachial: [Left:133] Ankle: [Left:Dorsalis Pedis: 50 0.38] Electronic Signature(s) Signed: 09/12/2020 6:02:38 PM By: Rhae Hammock RN Entered By: Rhae Hammock on 09/12/2020 13:28:24 -------------------------------------------------------------------------------- Multi-Disciplinary Care Plan Details Patient Name: Date of Service: Bethany Zimmerman, Bethany Zimmerman 09/12/2020 12:30 PM Medical Record Number: [id number] Patient Account Number: [id number] Date of  Birth/Sex: Treating RN: 09-Sep-1970 (50 y.o. Female) Deon Pilling Primary Care Mcdaniel Ohms: PA Haig Prophet, [place] Other Clinician: Referring Laquitha Heslin: Treating Jazlynne Milliner/Extender: Princess Perna in Treatment: 0 Active Inactive Orientation to the Wound Care Program Nursing Diagnoses: Knowledge deficit related to the wound healing center program Goals: Patient/caregiver will verbalize understanding of the [place] Program Date Initiated: 09/12/2020 Target Resolution Date: 10/18/2020 Goal Status: Active Interventions: Provide education on orientation to the wound center Notes: Pain, Acute or Chronic Nursing Diagnoses: Pain, acute or chronic: actual or potential Potential alteration in comfort, pain Goals: Patient will verbalize adequate pain control and receive pain control interventions during procedures as needed Date Initiated: 09/12/2020 Target Resolution Date: 10/17/2020 Goal Status: Active Patient/caregiver will verbalize comfort level met Date Initiated: 09/12/2020 Target Resolution Date: 10/17/2020 Goal Status: Active Interventions: Provide education on pain management Reposition patient for comfort Treatment Activities: Administer pain control measures as ordered : 09/12/2020 Notes: Tissue Oxygenation Nursing Diagnoses: Potential alteration in peripheral tissue perfusion (select prior to confirmation of diagnosis) Goals: Non-invasive arterial studies are completed as ordered Date Initiated: 09/12/2020 Target Resolution Date: 10/18/2020 Goal Status: Active Patient/caregiver will verbalize understanding of disease process and disease management Date Initiated: 09/12/2020 Target Resolution Date: 10/18/2020 Goal Status: Active Interventions: Assess patient understanding of disease process and management upon diagnosis and as needed Provide education on tissue oxygenation and ischemia Treatment Activities: Ankle Brachial Index (ABI) : 09/12/2020 Non-invasive  vascular studies : 09/12/2020 T ordered outside of clinic : 09/12/2020 est T Pressures (TBI) : 09/12/2020 oe Notes: Wound/Skin Impairment Nursing Diagnoses: Knowledge deficit related to ulceration/compromised skin integrity Goals: Patient/caregiver will verbalize understanding of skin care regimen Date Initiated: 09/12/2020 Target Resolution Date: 10/18/2020 Goal Status: Active Interventions: Assess patient/caregiver ability to obtain necessary supplies Assess patient/caregiver ability to perform ulcer/skin care regimen upon admission and as needed Provide education on ulcer and skin care Treatment Activities: Skin care regimen initiated : 09/12/2020 Topical wound management initiated : 09/12/2020 Notes: Electronic Signature(s) Signed: 09/12/2020 5:49:32 PM By: Deon Pilling Entered By: Deon Pilling on 09/12/2020 15:43:19 -------------------------------------------------------------------------------- Pain Assessment Details Patient Name: Date of Service: Bethany Zimmerman 09/12/2020 12:30 PM Medical Record Number: [id number] Patient Account Number: [id number] Date of Birth/Sex: Treating RN: 12-06-1970 (50 y.o. Female) Rhae Hammock Primary Care  Lenea Bywater: PA Darnelle Spangle Other Clinician: Referring Maximo Spratling: Treating Keyah Blizard/Extender: Princess Perna in Treatment: 0 Active Problems Location of Pain Severity and Description of Pain Patient Has Paino Yes Site Locations Pain Location: Pain in Ulcers With Dressing Change: Yes Duration of the Pain. Constant / Intermittento Intermittent Rate the pain. Current Pain Level: 9 Worst Pain Level: 10 Least Pain Level: 0 Tolerable Pain Level: 9 Character of Pain Describe the Pain: Aching Pain Management and Medication Current Pain Management: Medication: Yes Cold Application: No Rest: Yes Massage: No Activity: No T.E.N.S.: No Heat Application: No Leg drop or elevation: No Is the Current Pain Management  Adequate: Adequate How does your wound impact your activities of daily livingo Sleep: No Bathing: No Appetite: No Relationship With Others: No Bladder Continence: No Emotions: No Bowel Continence: No Work: No Toileting: No Drive: No Dressing: No Hobbies: No Electronic Signature(s) Signed: 09/12/2020 6:02:38 PM By: Rhae Hammock RN Entered By: Rhae Hammock on 09/12/2020 12:51:10 -------------------------------------------------------------------------------- Patient/Caregiver Education Details Patient Name: Date of Service: Bethany Zimmerman 3/3/2022andnbsp12:30 PM Medical Record Number: [id number] Patient Account Number: [id number] Date of Birth/Gender: Treating RN: 1970/11/11 (50 y.o. Female) Deon Pilling Primary Care Physician: PA Haig Prophet, [place] Other Clinician: Referring Physician: Treating Physician/Extender: Princess Perna in Treatment: 0 Education Assessment Education Provided To: Patient Education Topics Provided [place]: o Handouts: Welcome T [facility] o Methods: Explain/Verbal Responses: Reinforcements needed Electronic Signature(s) Signed: 09/12/2020 5:49:32 PM By: Deon Pilling Entered By: Deon Pilling on 09/12/2020 15:43:27 -------------------------------------------------------------------------------- Wound Assessment Details Patient Name: Date of Service: Bethany Zimmerman 09/12/2020 12:30 PM Medical Record Number: [id number] Patient Account Number: [id number] Date of Birth/Sex: Treating RN: 1970/12/10 (50 y.o. Female) Rhae Hammock Primary Care Latham Kinzler: PA Haig Prophet, [place] Other Clinician: Referring Clarrissa Shimkus: Treating Nishawn Rotan/Extender: Princess Perna in Treatment: 0 Wound Status Wound Number: 1 Primary Etiology: Diabetic Wound/Ulcer of the Lower Extremity Wound Location: Left, Plantar Metatarsal head fifth Wound Status: Open Wounding Event:  Trauma Comorbid History: Hypertension, Colitis, Type II Diabetes, Neuropathy Date Acquired: 04/29/2020 Weeks Of Treatment: 0 Clustered Wound: No Photos Wound Measurements Length: (cm) 0.1 Width: (cm) 0.1 Depth: (cm) 0.4 Area: (cm) 0.008 Volume: (cm) 0.003 % Reduction in Area: 0% % Reduction in Volume: 0% Epithelialization: Medium (34-66%) Tunneling: No Undermining: Yes Starting Position (o'clock): 12 Ending Position (o'clock): 12 Maximum Distance: (cm) 0.4 Wound Description Classification: Grade 2 Wound Margin: Distinct, outline attached Exudate Amount: Medium Exudate Type: Serosanguineous Exudate Color: red, brown Foul Odor After Cleansing: No Slough/Fibrino No Wound Bed Granulation Amount: Large (67-100%) Exposed Structure Granulation Quality: Red, Pink Fascia Exposed: No Necrotic Amount: None Present (0%) Fat Layer (Subcutaneous Tissue) Exposed: No Tendon Exposed: No Muscle Exposed: No Joint Exposed: No Bone Exposed: No Electronic Signature(s) Signed: 09/12/2020 5:30:41 PM By: Sandre Kitty Signed: 09/12/2020 6:02:38 PM By: Rhae Hammock RN Entered By: Sandre Kitty on 09/12/2020 17:21:47 -------------------------------------------------------------------------------- Vitals Details Patient Name: Date of Service: Bethany Zimmerman, Bethany Zimmerman 09/12/2020 12:30 PM Medical Record Number: [id number] Patient Account Number: [id number] Date of Birth/Sex: Treating RN: July 16, 1970 (50 y.o. Female) Rhae Hammock Primary Care Shiloh Swopes: PA Haig Prophet, [place] Other Clinician: Referring Matsuko Kretz: Treating Shantera Monts/Extender: Princess Perna in Treatment: 0 Vital Signs Time Taken: 12:47 Temperature (F): 98.3 Height (in): 67 Pulse (bpm): 103 Source: Stated Respiratory Rate (breaths/min): 17 Weight (lbs): 198 Blood Pressure (mmHg): 133/86 Source: Stated Capillary Blood Glucose (mg/dl): 94 Body Mass  Index (BMI): 31 Reference Range: 80 - 120 mg /  dl Electronic Signature(s) Signed: 09/12/2020 6:02:38 PM By: Rhae Hammock RN Entered By: Rhae Hammock on 09/12/2020 12:48:01

## 2020-09-25 ENCOUNTER — Ambulatory Visit (HOSPITAL_COMMUNITY): Admission: RE | Admit: 2020-09-25 | Payer: Medicare Other | Source: Ambulatory Visit

## 2020-09-25 ENCOUNTER — Encounter (HOSPITAL_COMMUNITY): Payer: Self-pay

## 2020-09-26 ENCOUNTER — Encounter (HOSPITAL_BASED_OUTPATIENT_CLINIC_OR_DEPARTMENT_OTHER): Payer: Medicare Other | Admitting: Internal Medicine

## 2020-09-30 ENCOUNTER — Other Ambulatory Visit (HOSPITAL_COMMUNITY): Payer: Self-pay | Admitting: Physician Assistant

## 2020-09-30 ENCOUNTER — Ambulatory Visit (HOSPITAL_COMMUNITY): Payer: Medicare Other

## 2020-09-30 DIAGNOSIS — R29898 Other symptoms and signs involving the musculoskeletal system: Secondary | ICD-10-CM

## 2020-10-02 ENCOUNTER — Ambulatory Visit (HOSPITAL_COMMUNITY): Payer: Medicare Other

## 2020-10-03 ENCOUNTER — Encounter (HOSPITAL_BASED_OUTPATIENT_CLINIC_OR_DEPARTMENT_OTHER): Payer: Medicare Other | Admitting: Internal Medicine

## 2020-10-03 ENCOUNTER — Other Ambulatory Visit: Payer: Self-pay

## 2020-10-03 DIAGNOSIS — E11621 Type 2 diabetes mellitus with foot ulcer: Secondary | ICD-10-CM | POA: Diagnosis not present

## 2020-10-03 NOTE — Progress Notes (Signed)
Bethany, Zimmerman ([id number]) Visit Report for 10/03/2020 HPI Details Patient Name: Date of Service: Bethany Zimmerman 10/03/2020 8:30 A M Medical Record Number: [id number] Patient Account Number: [id number] Date of Birth/Sex: Treating RN: 08-21-1970 (50 y.o. Bethany Zimmerman Primary Care Provider: PA Haig Prophet, [place] Other Clinician: Referring Provider: Treating Provider/Extender: Lendon Ka in Treatment: 3 History of Present Illness HPI Description: 09/12/2020 upon evaluation today patient appears to be doing somewhat poorly in regard to her left foot. She has a wound that has been present really since around October 2021 when her grandson ran over her foot with a toy car. Subsequently she tells me that around Thanksgiving she had cellulitis which she does not open and started draining and subsequently then healed after some antibiotic therapy. Again in December this reopened and she has been on doxycycline in December, January, and February. She was using manuka honey strips over this but then was told by the provider not to put anything on it that it needed to be in good "dry out". In 2016 she did have her first bout with peripheral vascular disease she is unfortunately had some issues here and she tells me that unfortunately when she last saw vascular they told her that they were somewhat reluctant to go in and do any type of work on her left leg she tells me due to the fact that she did not actually respond well to work done on her right leg. Nonetheless I am not certain if the ABI we got today is completely accurate but if it is then she may definitely need something in order to get this to heal effectively. She does have history of diabetes mellitus type 2, peripheral neuropathy though she tells me that the foot does hurt where this wound is. She has hypertension as well as again peripheral vascular disease. Fortunately there is no signs of obvious infection at this  point. 10/03/2020; patient I did not see on admission. She had a punched-out area over the left fifth met metatarsal head. Her ABI done at that time was 0.3. She did have prior interventions for PAD on the right leg but she says not the left. She is a diabetic with neuropathy. She does not describe claudication in the left leg. We ordered an MRI on her she apparently did not go to this also ordered follow-up arterial studies she did not go for this either. Nevertheless the wound area is actually closed over. She has been using a Clinical research associate) Signed: 10/03/2020 4:40:49 PM By: Linton Ham MD Entered By: Linton Ham on 10/03/2020 09:37:02 -------------------------------------------------------------------------------- Physical Exam Details Patient Name: Date of Service: Bethany Zimmerman 10/03/2020 8:30 A M Medical Record Number: [id number] Patient Account Number: [id number] Date of Birth/Sex: Treating RN: Aug 28, 1970 (50 y.o. Bethany Zimmerman Primary Care Provider: PA Haig Prophet, [place] Other Clinician: Referring Provider: Treating Provider/Extender: Lendon Ka in Treatment: 3 Constitutional Sitting or standing Blood Pressure is within target range for patient.. Pulse regular and within target range for patient.[name] Respirations regular, non-labored and within target range.. Temperature is normal and within the target range for the patient.[name] Appears in no distress. Cardiovascular Popliteal and femoral pulses on the left are palpable. Her posterior tibial, dorsalis pedis pulses are reduced but palpable. Notes Wound exam; the area was on the left fifth metatarsal head. Actually there is no open area here she has callus over this I gently removed some of this like  was it unable to identify any undermining the wound area. Electronic Signature(s) Signed: 10/03/2020 4:40:49 PM By: Linton Ham MD Entered By: Linton Ham on  10/03/2020 09:40:50 -------------------------------------------------------------------------------- Physician Orders Details Patient Name: Date of Service: Bethany Zimmerman 10/03/2020 8:30 A M Medical Record Number: [id number] Patient Account Number: [id number] Date of Birth/Sex: Treating RN: 13-Sep-1970 (50 y.o. Bethany Zimmerman Primary Care Provider: PA Haig Prophet, [place] Other Clinician: Referring Provider: Treating Provider/Extender: Lendon Ka in Treatment: 3 Verbal / Phone Orders: No Diagnosis Coding Discharge From [facility] Discharge from [place] - call if any future wound care needs. Non Wound Condition Protect area with: - pad the left foot closed area for protection x2 weeks. ensure to check feet daily. Wear diabetic shoes daily when walking and standing. Electronic Signature(s) Signed: 10/03/2020 4:40:49 PM By: Linton Ham MD Signed: 10/03/2020 4:43:39 PM By: Deon Pilling Entered By: Deon Pilling on 10/03/2020 09:22:34 -------------------------------------------------------------------------------- Problem List Details Patient Name: Date of Service: Bethany Zimmerman 10/03/2020 8:30 A M Medical Record Number: [id number] Patient Account Number: [id number] Date of Birth/Sex: Treating RN: Nov 12, 1970 (50 y.o. Bethany Zimmerman Primary Care Provider: PA Haig Prophet, [place] Other Clinician: Referring Provider: Treating Provider/Extender: Lendon Ka in Treatment: 3 Active Problems ICD-10 Encounter Code Description Active Date MDM Diagnosis E11.621 Type 2 diabetes mellitus with foot ulcer 09/12/2020 No Yes L97.522 Non-pressure chronic ulcer of other part of left foot with fat layer exposed 09/12/2020 No Yes E11.40 Type 2 diabetes mellitus with diabetic neuropathy, unspecified 09/12/2020 No Yes I10 Essential (primary) hypertension 09/12/2020 No Yes I73.89 Other specified peripheral vascular diseases 09/12/2020 No  Yes Inactive Problems Resolved Problems Electronic Signature(s) Signed: 10/03/2020 4:40:49 PM By: Linton Ham MD Entered By: Linton Ham on 10/03/2020 09:35:17 -------------------------------------------------------------------------------- Progress Note Details Patient Name: Date of Service: Bethany Zimmerman 10/03/2020 8:30 A M Medical Record Number: [id number] Patient Account Number: [id number] Date of Birth/Sex: Treating RN: 1970/09/27 (50 y.o. Bethany Zimmerman Primary Care Provider: PA Haig Prophet, [place] Other Clinician: Referring Provider: Treating Provider/Extender: Lendon Ka in Treatment: 3 Subjective History of Present Illness (HPI) 09/12/2020 upon evaluation today patient appears to be doing somewhat poorly in regard to her left foot. She has a wound that has been present really since around October 2021 when her grandson ran over her foot with a toy car. Subsequently she tells me that around Thanksgiving she had cellulitis which she does not open and started draining and subsequently then healed after some antibiotic therapy. Again in December this reopened and she has been on doxycycline in December, January, and February. She was using manuka honey strips over this but then was told by the provider not to put anything on it that it needed to be in good "dry out". In 2016 she did have her first bout with peripheral vascular disease she is unfortunately had some issues here and she tells me that unfortunately when she last saw vascular they told her that they were somewhat reluctant to go in and do any type of work on her left leg she tells me due to the fact that she did not actually respond well to work done on her right leg. Nonetheless I am not certain if the ABI we got today is completely accurate but if it is then she may definitely need something in order to get this to heal effectively. She does have history of diabetes mellitus type 2, peripheral  neuropathy  though she tells me that the foot does hurt where this wound is. She has hypertension as well as again peripheral vascular disease. Fortunately there is no signs of obvious infection at this point. 10/03/2020; patient I did not see on admission. She had a punched-out area over the left fifth met metatarsal head. Her ABI done at that time was 0.3. She did have prior interventions for PAD on the right leg but she says not the left. She is a diabetic with neuropathy. She does not describe claudication in the left leg. We ordered an MRI on her she apparently did not go to this also ordered follow-up arterial studies she did not go for this either. Nevertheless the wound area is actually closed over. She has been using a Darco forefoot offloading boot Objective Constitutional Sitting or standing Blood Pressure is within target range for patient.. Pulse regular and within target range for patient.[name] Respirations regular, non-labored and within target range.. Temperature is normal and within the target range for the patient.[name] Appears in no distress. Vitals Time Taken: 8:37 AM, Height: 67 in, Weight: 198 lbs, BMI: 31, Temperature: 98.2 F, Pulse: 90 bpm, Respiratory Rate: 17 breaths/min, Blood Pressure: 125/78 mmHg, Capillary Blood Glucose: 87 mg/dl. Cardiovascular Popliteal and femoral pulses on the left are palpable. Her posterior tibial, dorsalis pedis pulses are reduced but palpable. General Notes: Wound exam; the area was on the left fifth metatarsal head. Actually there is no open area here she has callus over this I gently removed some of this like was it unable to identify any undermining the wound area. Integumentary (Hair, Skin) Wound #1 status is Open. Original cause of wound was Trauma. The date acquired was: 04/29/2020. The wound has been in treatment 3 weeks. The wound is located on the Left,Plantar Metatarsal head fifth. The wound measures 0cm length x 0cm width x 0cm depth; 0cm^2  area and 0cm^3 volume. Assessment Active Problems ICD-10 Type 2 diabetes mellitus with foot ulcer Non-pressure chronic ulcer of other part of left foot with fat layer exposed Type 2 diabetes mellitus with diabetic neuropathy, unspecified Essential (primary) hypertension Other specified peripheral vascular diseases Plan Discharge From [facility] Services: Discharge from [facility] Center - call if any future wound care needs. Non Wound Condition: Protect area with: - pad the left foot closed area for protection x2 weeks. ensure to check feet daily. Wear diabetic shoes daily when walking and standing. 1. The area on the left foot is closed I recommended keeping this covered with border foam 2. She tells me she has custom diabetic shoes but no inserts 3. She has PAD. I had our staff repeat her ABIs in the clinic at 0.54. She does not describe claudication nevertheless I think she needs are arterial study follow-up at [facility] and apparently these have already been arranged but may be not on the left 4. She did not show up for her MRI that we arranged nevertheless the wound is healed and there is very little evidence of infection. She tells me that she did have an infection when this first started in October. If there is another issue here I am not aware of it at the time of this dictation. I told her to come back and see [place] if this wound reopens Electronic Signature(s) Signed: 10/03/2020 4:40:49 PM By: Baltazar Najjar MD Entered By: Baltazar Najjar on 10/03/2020 09:42:13 -------------------------------------------------------------------------------- SuperBill Details Patient Name: Date of Service: Verne Spurr, A Zimmerman 10/03/2020 Medical Record Number: [id number] Patient Account  Number: [id number] Date of Birth/Sex: Treating RN: [birth year] (50 y.o. Bethany Zimmerman Primary Care Provider: PA TIENT, [place] Other Clinician: Referring Provider: Treating Provider/Extender: Lendon Ka in Treatment: 3 Diagnosis Coding ICD-10 Codes Code Description E11.621 Type 2 diabetes mellitus with foot ulcer L97.522 Non-pressure chronic ulcer of other part of left foot with fat layer exposed E11.40 Type 2 diabetes mellitus with diabetic neuropathy, unspecified I10 Essential (primary) hypertension I73.89 Other specified peripheral vascular diseases Facility Procedures CPT4 Code: [id number] Description: 99214 - WOUND CARE VISIT-LEV 4 EST PT Modifier: Quantity: 1 Physician Procedures Electronic Signature(s) Signed: 10/03/2020 4:40:49 PM By: Linton Ham MD Entered By: Linton Ham on 10/03/2020 09:42:39

## 2020-10-03 NOTE — Progress Notes (Signed)
Bethany Zimmerman ([id number]) Visit Report for 10/03/2020 Arrival Information Details Patient Name: Date of Service: Bethany Zimmerman 10/03/2020 8:30 A M Medical Record Number: [id number] Patient Account Number: [id number] Date of Birth/Sex: Treating RN: [birth year] (49 y.o. Bethany Zimmerman, Bethany Zimmerman Primary Care Bethany Zimmerman: PA Bethany Zimmerman, [place] Other Clinician: Referring Bethany Zimmerman: Treating Bethany Zimmerman/Extender: Bethany Zimmerman in Treatment: 3 Visit Information History Since Last Visit Added or deleted any medications: No Patient Arrived: Ambulatory Any new allergies or adverse reactions: No Arrival Time: 08:37 Had a fall or experienced change in No Accompanied By: self activities of daily living that may affect Transfer Assistance: None risk of falls: Patient Identification Verified: Yes Signs or symptoms of abuse/neglect since last visito No Secondary Verification Process Completed: Yes Hospitalized since last visit: No Patient Requires Transmission-Based Precautions: No Implantable device outside of the clinic excluding No Patient Has Alerts: No cellular tissue based products placed in the center since last visit: Has Dressing in Place as Prescribed: Yes Pain Present Now: No Electronic Signature(s) Signed: 10/03/2020 5:01:28 PM By: Fonnie Mu RN Entered By: Fonnie Mu on 10/03/2020 08:37:52 -------------------------------------------------------------------------------- Clinic Level of Care Assessment Details Patient Name: Date of Service: Bethany Zimmerman Bethany Zimmerman 10/03/2020 8:30 A M Medical Record Number: [id number] Patient Account Number: [id number] Date of Birth/Sex: Treating RN: Nov 22, 1970 (49 y.o. Bethany Zimmerman, [name] Primary Care Gillie Crisci: PA Bethany Zimmerman, [place] Other Clinician: Referring Leesha Veno: Treating Arran Fessel/Extender: Bethany Zimmerman in Treatment: 3 Clinic Level of Care Assessment Items TOOL 4 Quantity Score X- 1 0 Use when only an  EandM is performed on FOLLOW-UP visit ASSESSMENTS - Nursing Assessment / Reassessment X- 1 10 Reassessment of Co-morbidities (includes updates in patient status) X- 1 5 Reassessment of Adherence to Treatment Plan ASSESSMENTS - Wound and Skin A ssessment / Reassessment X - Simple Wound Assessment / Reassessment - one wound 1 5 []  - 0 Complex Wound Assessment / Reassessment - multiple wounds X- 1 10 Dermatologic / Skin Assessment (not related to wound area) ASSESSMENTS - Focused Assessment X- 1 5 Circumferential Edema Measurements - multi extremities X- 1 10 Nutritional Assessment / Counseling / Intervention []  - 0 Lower Extremity Assessment (monofilament, tuning fork, pulses) []  - 0 Peripheral Arterial Disease Assessment (using hand held doppler) ASSESSMENTS - Ostomy and/or Continence Assessment and Care []  - 0 Incontinence Assessment and Management []  - 0 Ostomy Care Assessment and Management (repouching, etc.) PROCESS - Coordination of Care X - Simple Patient / Family Education for ongoing care 1 15 []  - 0 Complex (extensive) Patient / Family Education for ongoing care X- 1 10 Staff obtains , Records, T Results / Process Orders est []  - 0 Staff telephones HHA, Nursing Homes / Clarify orders / etc []  - 0 Routine Transfer to another Facility (non-emergent condition) []  - 0 Routine Hospital Admission (non-emergent condition) []  - 0 New Admissions / / Ordering NPWT Apligraf, etc. , []  - 0 Emergency Hospital Admission (emergent condition) X- 1 10 Simple Discharge Coordination []  - 0 Complex (extensive) Discharge Coordination PROCESS - Special Needs []  - 0 Pediatric / Minor Patient Management []  - 0 Isolation Patient Management []  - 0 Hearing / Language / Visual special needs []  - 0 Assessment of Community assistance (transportation, D/C planning, etc.) []  - 0 Additional assistance / Altered mentation []  - 0 Support Surface(s)  Assessment (bed, cushion, seat, etc.) INTERVENTIONS - Wound Cleansing / Measurement X - Simple Wound Cleansing - one wound 1 5 []  - 0 Complex Wound  Cleansing - multiple wounds X- 1 5 Wound Imaging (photographs - any number of wounds) []  - 0 Wound Tracing (instead of photographs) X- 1 5 Simple Wound Measurement - one wound []  - 0 Complex Wound Measurement - multiple wounds INTERVENTIONS - Wound Dressings X - Small Wound Dressing one or multiple wounds 1 10 []  - 0 Medium Wound Dressing one or multiple wounds []  - 0 Large Wound Dressing one or multiple wounds []  - 0 Application of Medications - topical []  - 0 Application of Medications - injection INTERVENTIONS - Miscellaneous []  - 0 External ear exam []  - 0 Specimen Collection (cultures, biopsies, blood, body fluids, etc.) []  - 0 Specimen(s) / Culture(s) sent or taken to Lab for analysis []  - 0 Patient Transfer (multiple staff / / Similar devices) []  - 0 Simple Staple / Suture removal (25 or less) []  - 0 Complex Staple / Suture removal (26 or more) []  - 0 Hypo / Hyperglycemic Management (close monitor of Blood Glucose) X- 1 15 Ankle / Brachial Index (ABI) - do not check if billed separately X- 1 5 Vital Signs Has the patient been seen at the hospital within the last three years: Yes Total Score: 125 Level Of Care: New/Established - Level 4 Electronic Signature(s) Signed: 10/03/2020 4:43:39 PM By: Entered By: on 10/03/2020 09:35:27 -------------------------------------------------------------------------------- Encounter Discharge Information Details Patient Name: Date of Service: , A Bethany Zimmerman 10/03/2020 8:30 A M Medical Record Number: Patient Account Number: Date of Birth/Sex: Treating RN: [birth year] (49 y.o. Nurse, adult Primary Care Nyema Hachey: PA , Other Clinician: Referring Cashius Grandstaff: Treating Stela Iwasaki/Extender: in Treatment: 3 Encounter Discharge Information Items Discharge Condition: Stable Ambulatory Status: Ambulatory Discharge Destination: Home Transportation: Private Auto Accompanied By: self Schedule Follow-up Appointment: No Clinical Summary of Care: Notes foam border applied. Electronic Signature(s) Signed: 10/03/2020 4:43:39 PM By: Shawn Stall Entered By: Shawn Stall on 10/03/2020 09:35:57 -------------------------------------------------------------------------------- Lower Extremity Assessment Details Patient Name: Date of Service: Bethany Zimmerman 10/03/2020 8:30 A M Medical Record Number: [id number] Patient Account Number: [id number] Date of Birth/Sex: Treating RN: Jan 13, 1971 (49 y.o. Arta Silence, Bethany Zimmerman Primary Care Romualdo Prosise: PA Bethany Zimmerman, [place] Other Clinician: Referring Jahki Witham: Treating Breckyn Ticas/Extender: Bethany Zimmerman in Treatment: 3 Edema Assessment Assessed: [Left: Yes] [Right: No] Edema: [Left: N] [Right: o] Calf Left: Right: Point of Measurement: 33 cm From Medial Instep 34 cm Ankle Left: Right: Point of Measurement: 10 cm From Medial Instep 21 cm Vascular Assessment Pulses: Dorsalis Pedis Palpable: [Left:Yes] Posterior Tibial Palpable: [Left:Yes] Blood Pressure: Brachial: [Left:125] Dorsalis Pedis: 58 Ankle: Posterior Tibial: 68 Ankle Brachial Index: [Left:0.54] Electronic Signature(s) Signed: 10/03/2020 5:01:28 PM By: Shawn Stall RN Entered By: Shawn Stall on 10/03/2020 09:32:27 -------------------------------------------------------------------------------- Multi-Disciplinary Care Plan Details Patient Name: Date of Service: Bethany Zimmerman, A Bethany Zimmerman 10/03/2020 8:30 A M Medical Record Number: [id number] Patient Account Number: [id number] Date of Birth/Sex: Treating RN: 16-Nov-1970 (49 y.o. Bethany Zimmerman Primary Care Hommer Cunliffe: PA Bethany Zimmerman, [place] Other Clinician: Referring  Zanyiah Posten: Treating Johnta Couts/Extender: Bethany Zimmerman in Treatment: 3 Active Inactive Electronic Signature(s) Signed: 10/03/2020 4:43:39 PM By: Fonnie Mu Entered By: Fonnie Mu on 10/03/2020 09:36:43 -------------------------------------------------------------------------------- Pain Assessment Details Patient Name: Date of Service: Bethany Zimmerman Bethany Zimmerman 10/03/2020 8:30 A M Medical Record Number: [id number] Patient Account Number: [id number] Date of Birth/Sex: Treating RN: Mar 26, 1971 (49 y.o. Arta Silence, Bethany Zimmerman Primary Care Jeray Shugart: PA Bethany Zimmerman, [place] Other Clinician: Referring  Denson Niccoli: Treating Merissa Renwick/Extender: Bethany Zimmerman in Treatment: 3 Active Problems Location of Pain Severity and Description of Pain Patient Has Paino No Site Locations Rate the pain. Rate the pain. Current Pain Level: 0 Pain Management and Medication Current Pain Management: Electronic Signature(s) Signed: 10/03/2020 5:01:28 PM By: Fonnie Mu RN Entered By: Fonnie Mu on 10/03/2020 08:38:20 -------------------------------------------------------------------------------- Patient/Caregiver Education Details Patient Name: Date of Service: Bethany Zimmerman, A Bethany Zimmerman 3/24/2022andnbsp8:30 A M Medical Record Number: [id number] Patient Account Number: [id number] Date of Birth/Gender: Treating RN: September 30, 1970 (49 y.o. Arta Silence Primary Care Physician: PA Bethany Zimmerman, [place] Other Clinician: Referring Physician: Treating Physician/Extender: Bethany Zimmerman in Treatment: 3 Education Assessment Education Provided To: Patient Education Topics Provided Pain: Handouts: A Guide to Pain Control Methods: Explain/Verbal Responses: Reinforcements needed Electronic Signature(s) Signed: 10/03/2020 4:43:39 PM By: Shawn Stall Entered By: Shawn Stall on 10/03/2020  08:58:21 -------------------------------------------------------------------------------- Wound Assessment Details Patient Name: Date of Service: Bethany Zimmerman, A Bethany Zimmerman 10/03/2020 8:30 A M Medical Record Number: [id number] Patient Account Number: [id number] Date of Birth/Sex: Treating RN: 1970/09/06 (49 y.o. Bethany Zimmerman, Bethany Zimmerman Primary Care Mihail Prettyman: PA [place]ENT, [place] Other Clinician: Referring Gurnoor Ursua: Treating Sundi Slevin/Extender: Bethany Zimmerman in Treatment: 3 Wound Status Wound Number: 1 Primary Etiology: Diabetic Wound/Ulcer of the Lower Extremity Wound Location: Left, Plantar Metatarsal head fifth Wound Status: Open Wounding Event: Trauma Date Acquired: 04/29/2020 Weeks Of Treatment: 3 Clustered Wound: No Wound Measurements Length: (cm) Width: (cm) Depth: (cm) Area: (cm) Volume: (cm) 0 % Reduction in Area: 100% 0 % Reduction in Volume: 100% 0 0 0 Wound Description Classification: Grade 2 Electronic Signature(s) Signed: 10/03/2020 5:01:28 PM By: Fonnie Mu RN Entered By: Fonnie Mu on 10/03/2020 08:44:26 -------------------------------------------------------------------------------- Vitals Details Patient Name: Date of Service: Bethany Zimmerman, A Bethany Zimmerman 10/03/2020 8:30 A M Medical Record Number: [id number] Patient Account Number: [id number] Date of Birth/Sex: Treating RN: [birth year] (49 y.o. Bethany Zimmerman, Bethany Zimmerman Primary Care Matilde Markie: PA Bethany Zimmerman, [place] Other Clinician: Referring Dolan Xia: Treating Azeneth Carbonell/Extender: Bethany Zimmerman in Treatment: 3 Vital Signs Time Taken: 08:37 Temperature (F): 98.2 Height (in): 67 Pulse (bpm): 90 Weight (lbs): 198 Respiratory Rate (breaths/min): 17 Body Mass Index (BMI): 31 Blood Pressure (mmHg): 125/78 Capillary Blood Glucose (mg/dl): 87 Reference Range: 80 - 120 mg / dl Electronic Signature(s) Signed: 10/03/2020 5:01:28 PM By: Fonnie Mu RN Entered By: Fonnie Mu on 10/03/2020 08:39:30

## 2021-03-06 ENCOUNTER — Encounter (HOSPITAL_COMMUNITY): Payer: Self-pay

## 2021-03-06 ENCOUNTER — Ambulatory Visit (HOSPITAL_COMMUNITY)
Admission: EM | Admit: 2021-03-06 | Discharge: 2021-03-06 | Disposition: A | Discharge status: Home / Self Care | Payer: Medicare Other | Attending: Emergency Medicine | Admitting: Emergency Medicine

## 2021-03-06 ENCOUNTER — Other Ambulatory Visit: Payer: Self-pay

## 2021-03-06 DIAGNOSIS — R21 Rash and other nonspecific skin eruption: Secondary | ICD-10-CM

## 2021-03-06 MED ORDER — PREDNISONE 20 MG PO TABS: 40.0000 mg | ORAL_TABLET | Freq: Every day | ORAL | 0 refills | Status: DC

## 2021-03-06 MED ORDER — CEPHALEXIN 500 MG PO CAPS: 500.0000 mg | ORAL_CAPSULE | Freq: Four times a day (QID) | ORAL | 0 refills | Status: DC

## 2021-03-06 MED ORDER — CEPHALEXIN 500 MG PO CAPS: 1000.0000 mg | ORAL_CAPSULE | Freq: Two times a day (BID) | ORAL | 0 refills | Status: DC

## 2021-03-06 NOTE — Discharge Instructions (Addendum)
Your rash is being caused by either cellulitis which is a skin infection or exposure to any poison oak/ivy, therefore today we will move forward treating you for both  Starting today take prednisone with food for the next 5 days, your blood sugars closely as this medication will cause them to increase, as you complete the medication your level should return to normal  Take Keflex twice a day for the next 5 days  Please return to clinic if rash worsens or continues to spread for further evaluation

## 2021-03-06 NOTE — ED Provider Notes (Signed)
[facility]    CSN: [id number] Arrival date & time: 03/06/21  0813      History   Chief Complaint Chief Complaint  Patient presents with   Rash    HPI Bethany Zimmerman is a 50 y.o. female.   Patient presents with rash to the lower right leg for 2-1/2 weeks beginning while on a outdoor wilderness retreat.  Rashes not pruritic.  Has not spread.  Has 1 area on the anterior of the leg that has started to drain small amount of yellowish fluid.  Has been applying calamine lotion to area with no improvement.  Denies any change in lotions, soaps, detergents, linens, diet, no out of the country travel, fever, chills.  History of diabetes type 2, controlled.   Past Medical History:  Diagnosis Date   Diabetes mellitus without complication ([place])     There are no problems to display for this patient.   Past Surgical History:  Procedure Laterality Date   ABDOMINAL SURGERY     CESAREAN SECTION     CHOLECYSTECTOMY      OB History   No obstetric history on file.      Home Medications    Prior to Admission medications   Medication Sig Start Date End Date Taking? Authorizing Provider  predniSONE (DELTASONE) 20 MG tablet Take 2 tablets (40 mg total) by mouth daily. 03/06/21  Yes Domenic Schoenberger R, NP  bacitracin ointment Apply 1 application topically 2 (two) times daily. 02/02/19   Petrucelli, Samantha R, PA-C  Blood Glucose Monitoring Suppl (ONETOUCH VERIO FLEX SYSTEM) w/Device KIT 1 (ONE) KIT AS DIRECTED 11/18/17   [provider]  cephALEXin (KEFLEX) 500 MG capsule Take 2 capsules (1,000 mg total) by mouth 2 (two) times daily. 03/06/21   Malikiah Debarr, Leitha Schuller, NP  cilostazol (PLETAL) 100 MG tablet TAKE 1 TABLET (100 MG TOTAL) BY MOUTH 2 (TWO) TIMES DAILY BEFORE A MEAL. 08/24/19   Serafina Mitchell, MD  fluticasone Asencion Islam) 50 MCG/ACT nasal spray  10/15/18   [provider]  gabapentin (NEURONTIN) 800 MG tablet Take 800 mg by mouth 3 (three) times daily. 12/13/17    [provider]  JANUVIA 100 MG tablet Take 100 mg by mouth daily. 08/19/18   [provider]  ketoconazole (NIZORAL) 2 % cream Apply 1 application topically daily. 12/17/17   [provider]  metFORMIN (GLUCOPHAGE) 1000 MG tablet Take 1,000 mg by mouth 2 (two) times daily.    [provider]  methocarbamol (ROBAXIN) 750 MG tablet Take 750 mg by mouth 3 (three) times daily. 10/20/18   [provider]  Misc. Devices MISC Walker with seat Dx: M62.81 08/14/20   Volney American, PA-C  omeprazole (PRILOSEC) 20 MG capsule TAKE 1 CAPSULE BY MOUTH TWICE A DAY AS NEEDED 09/30/18   [provider]  ondansetron (ZOFRAN ODT) 4 MG disintegrating tablet Take 1 tablet (4 mg total) by mouth every 8 (eight) hours as needed for nausea or vomiting. 08/14/20   Volney American, PA-C  [facility] VERIO test strip TEST 3 TIMES A DAY E11.65 11/18/17   [provider]  rosuvastatin (CRESTOR) 10 MG tablet Take 1 tablet (10 mg total) by mouth at bedtime. 10/24/18 10/24/19  Serafina Mitchell, MD    Family History Family History  Problem Relation Age of Onset   Heart failure Mother     Social History Social History   Tobacco Use   Smoking status: Former   Smokeless tobacco:  Never  Vaping Use   Vaping Use: Never used  Substance Use Topics   Alcohol use: Never   Drug use: Never     Allergies   Erythromycin, Sulfa antibiotics, and Clindamycin/lincomycin   Review of Systems Review of Systems  Constitutional: Negative.   Respiratory: Negative.    Cardiovascular: Negative.   Skin:  Positive for rash. Negative for color change, pallor and wound.    Physical Exam Triage Vital Signs ED Triage Vitals  Enc Vitals Group     BP 03/06/21 0836 (!) 153/79     Pulse Rate 03/06/21 0836 100     Resp 03/06/21 0836 18     Temp 03/06/21 0836 98.6 F (37 C)     Temp Source 03/06/21 0836 Oral     SpO2 03/06/21 0836 95 %     Weight --      Height --       Head Circumference --      Peak Flow --      Pain Score 03/06/21 0837 0     Pain Loc --      Pain Edu? --      Excl. in [place]? --    No data found.  Updated Vital Signs BP (!) 153/79 (BP Location: Left Arm)   Pulse 100   Temp 98.6 F (37 C) (Oral)   Resp 18   SpO2 95%   Visual Acuity Right Eye Distance:   Left Eye Distance:   Bilateral Distance:    Right Eye Near:   Left Eye Near:    Bilateral Near:     Physical Exam Constitutional:      Appearance: Normal appearance. She is normal weight.  HENT:     Head: Normocephalic.  Eyes:     Extraocular Movements: Extraocular movements intact.  Pulmonary:     Effort: Pulmonary effort is normal.  Skin:    Comments: Defer to photo below  Neurological:     General: No focal deficit present.     Mental Status: She is alert and oriented to person, place, and time. Mental status is at baseline.  Psychiatric:        Mood and Affect: Mood normal.        Behavior: Behavior normal.        UC Treatments / Results  Labs (all labs ordered are listed, but only abnormal results are displayed) Labs Reviewed - No data to display  EKG   Radiology No results found.  Procedures Procedures (including critical care time)  Medications Ordered in UC Medications - No data to display  Initial Impression / Assessment and Plan / UC Course  I have reviewed the triage vital signs and the nursing notes.  Pertinent labs & imaging results that were available during my care of the patient were reviewed by me and considered in my medical decision making (see chart for details).  Rash  Etiology most likely cellulitis versus poison oak/ivy exposure, today we will move forward with treating for both  1.  Keflex 1000 mg twice daily for 5 days 2.  Prednisone 40 mg daily for 5 days, discussed effect of medication on blood sugars, patient already affects blood sugar 3 times a day, last A1c between 6-7 3.  Strict return precautions given for  worsening or spreading rash to return to urgent care for reevaluation Final Clinical Impressions(s) / UC Diagnoses   Final diagnoses:  Rash     Discharge Instructions      Your  rash is being caused by either cellulitis which is a skin infection or exposure to any poison oak/ivy, therefore today we will move forward treating you for both  Starting today take prednisone with food for the next 5 days, your blood sugars closely as this medication will cause them to increase, as you complete the medication your level should return to normal  Take Keflex twice a day for the next 5 days  Please return to clinic if rash worsens or continues to spread for further evaluation   ED Prescriptions     Medication Sig Dispense Auth. Provider   cephALEXin (KEFLEX) 500 MG capsule  (Status: Discontinued) Take 1 capsule (500 mg total) by mouth 4 (four) times daily. 20 capsule Sharicka Pogorzelski R, NP   predniSONE (DELTASONE) 20 MG tablet Take 2 tablets (40 mg total) by mouth daily. 10 tablet Alicen Donalson R, NP   cephALEXin (KEFLEX) 500 MG capsule Take 2 capsules (1,000 mg total) by mouth 2 (two) times daily. 20 capsule Hans Eden, NP      PDMP not reviewed this encounter.   Hans Eden, [place] 03/06/21 [phone]

## 2021-03-06 NOTE — ED Triage Notes (Signed)
Pt presents with progressing rash on right leg; pt denies itchiness and pain X 2 weeks.

## 2021-04-07 ENCOUNTER — Encounter (HOSPITAL_COMMUNITY): Payer: Self-pay

## 2021-04-07 ENCOUNTER — Ambulatory Visit (HOSPITAL_COMMUNITY)
Admission: EM | Admit: 2021-04-07 | Discharge: 2021-04-07 | Disposition: A | Discharge status: Home / Self Care | Payer: Medicare Other | Attending: Emergency Medicine | Admitting: Emergency Medicine

## 2021-04-07 ENCOUNTER — Other Ambulatory Visit: Payer: Self-pay

## 2021-04-07 DIAGNOSIS — H66002 Acute suppurative otitis media without spontaneous rupture of ear drum, left ear: Secondary | ICD-10-CM | POA: Diagnosis not present

## 2021-04-07 MED ORDER — AMOXICILLIN 500 MG PO CAPS: 500.0000 mg | ORAL_CAPSULE | Freq: Two times a day (BID) | ORAL | 0 refills | Status: AC

## 2021-04-07 NOTE — Discharge Instructions (Signed)
Take the amoxicillin twice a day for the next 7 days.   You can Tylenol and or ibuprofen as needed for pain relief and fever reduction. Increase the Flonase to 2 sprays in each nostril daily for the next week. Continue allergy medication as previously prescribed.  Make sure you are drinking plenty of fluids, especially water.  Return or go to the Emergency Department if symptoms worsen or do not improve in the next few days.

## 2021-04-07 NOTE — ED Provider Notes (Signed)
[place]    CSN: [id number] Arrival date & time: 04/07/21  0806      History   Chief Complaint Chief Complaint  Patient presents with   Otalgia   Headache   URI    HPI Bethany Zimmerman is a 50 y.o. female.   Patient here for evaluation of bilateral ear pain, nasal congestion, sore throat, and headache that has been ongoing for the past 5 days.  Patient does report getting her flu shot approximately 8 days ago.  Reports low-grade fevers at night that are responsive to Tylenol.  Reports getting similar symptoms every year when the seasons change.  Reports a history of seasonal allergies for which she takes Flonase and Singulair.  Reports at home COVID test was negative last night.  Denies any trauma, injury, or other precipitating event.  Denies any chest pain, shortness of breath, N/V/D, numbness, tingling, weakness, abdominal pain.    The history is provided by the patient.  Otalgia Associated symptoms: congestion, cough, fever, headaches and sore throat   Headache Associated symptoms: congestion, cough, ear pain, fever, sore throat and URI   URI Presenting symptoms: congestion, cough, ear pain, fever and sore throat   Associated symptoms: headaches    Past Medical History:  Diagnosis Date   Diabetes mellitus without complication ([place])     There are no problems to display for this patient.   Past Surgical History:  Procedure Laterality Date   ABDOMINAL SURGERY     CESAREAN SECTION     CHOLECYSTECTOMY      OB History   No obstetric history on file.      Home Medications    Prior to Admission medications   Medication Sig Start Date End Date Taking? Authorizing Provider  amoxicillin (AMOXIL) 500 MG capsule Take 1 capsule (500 mg total) by mouth 2 (two) times daily for 7 days. 04/07/21 04/14/21 Yes Pearson Forster, NP  bacitracin ointment Apply 1 application topically 2 (two) times daily. 02/02/19   Petrucelli, Samantha R, PA-C  Blood Glucose  Monitoring Suppl (ONETOUCH VERIO FLEX SYSTEM) w/Device KIT 1 (ONE) KIT AS DIRECTED 11/18/17   [provider]  cephALEXin (KEFLEX) 500 MG capsule Take 2 capsules (1,000 mg total) by mouth 2 (two) times daily. 03/06/21   White, Leitha Schuller, NP  cilostazol (PLETAL) 100 MG tablet TAKE 1 TABLET (100 MG TOTAL) BY MOUTH 2 (TWO) TIMES DAILY BEFORE A MEAL. 08/24/19   Serafina Mitchell, MD  fluticasone Asencion Islam) 50 MCG/ACT nasal spray  10/15/18   [provider]  gabapentin (NEURONTIN) 800 MG tablet Take 800 mg by mouth 3 (three) times daily. 12/13/17   [provider]  JANUVIA 100 MG tablet Take 100 mg by mouth daily. 08/19/18   [provider]  ketoconazole (NIZORAL) 2 % cream Apply 1 application topically daily. 12/17/17   [provider]  metFORMIN (GLUCOPHAGE) 1000 MG tablet Take 1,000 mg by mouth 2 (two) times daily.    [provider]  methocarbamol (ROBAXIN) 750 MG tablet Take 750 mg by mouth 3 (three) times daily. 10/20/18   [provider]  Misc. Devices MISC Walker with seat Dx: M62.81 08/14/20   Volney American, PA-C  omeprazole (PRILOSEC) 20 MG capsule TAKE 1 CAPSULE BY MOUTH TWICE A DAY AS NEEDED 09/30/18   [provider]  ondansetron (ZOFRAN ODT) 4 MG disintegrating tablet Take 1 tablet (4 mg total) by mouth every 8 (eight) hours as needed for nausea or vomiting. 08/14/20  Merrie Roof Broussard, PA-C  [facility] VERIO test strip TEST 3 TIMES A DAY E11.65 11/18/17   [provider]  predniSONE (DELTASONE) 20 MG tablet Take 2 tablets (40 mg total) by mouth daily. 03/06/21   Hans Eden, NP  rosuvastatin (CRESTOR) 10 MG tablet Take 1 tablet (10 mg total) by mouth at bedtime. 10/24/18 10/24/19  Serafina Mitchell, MD    Family History Family History  Problem Relation Age of Onset   Heart failure Mother     Social History Social History   Tobacco Use   Smoking status: Former   Smokeless tobacco: Never  Brewing technologist Use: Never used  Substance Use Topics   Alcohol use: Never   Drug use: Never     Allergies   Erythromycin, Sulfa antibiotics, and Clindamycin/lincomycin   Review of Systems Review of Systems  Constitutional:  Positive for fever.  HENT:  Positive for congestion, ear pain and sore throat.   Respiratory:  Positive for cough.   Neurological:  Positive for headaches.  All other systems reviewed and are negative.   Physical Exam Triage Vital Signs ED Triage Vitals  Enc Vitals Group     BP 04/07/21 0831 (!) 144/87     Pulse Rate 04/07/21 0831 85     Resp 04/07/21 0831 17     Temp 04/07/21 0831 98.5 F (36.9 C)     Temp Source 04/07/21 0831 Oral     SpO2 04/07/21 0831 96 %     Weight --      Height --      Head Circumference --      Peak Flow --      Pain Score 04/07/21 0834 7     Pain Loc --      Pain Edu? --      Excl. in [place]? --    No data found.  Updated Vital Signs BP (!) 144/87 (BP Location: Right Arm)   Pulse 85   Temp 98.5 F (36.9 C) (Oral)   Resp 17   SpO2 96%   Visual Acuity Right Eye Distance:   Left Eye Distance:   Bilateral Distance:    Right Eye Near:   Left Eye Near:    Bilateral Near:     Physical Exam Vitals and nursing note reviewed.  Constitutional:      General: She is not in acute distress.    Appearance: Normal appearance. She is not ill-appearing, toxic-appearing or diaphoretic.  HENT:     Head: Normocephalic and atraumatic.     Right Ear: Tympanic membrane and ear canal normal.     Left Ear: Swelling present. Tympanic membrane is injected and erythematous.     Nose: Congestion present.     Mouth/Throat:     Pharynx: Posterior oropharyngeal erythema present.  Eyes:     Extraocular Movements: Extraocular movements intact.     Conjunctiva/sclera: Conjunctivae normal.     Pupils: Pupils are equal, round, and reactive to light.  Cardiovascular:     Rate and Rhythm: Normal rate.     Pulses: Normal pulses.     Heart sounds:  Normal heart sounds.  Pulmonary:     Effort: Pulmonary effort is normal.     Breath sounds: Normal breath sounds.  Abdominal:     General: Abdomen is flat.  Musculoskeletal:        General: Normal range of motion.     Cervical back: Normal range of motion.  Skin:  General: Skin is warm and dry.  Neurological:     General: No focal deficit present.     Mental Status: She is alert and oriented to person, place, and time.     GCS: GCS eye subscore is 4. GCS verbal subscore is 5. GCS motor subscore is 6.  Psychiatric:        Mood and Affect: Mood normal.     UC Treatments / Results  Labs (all labs ordered are listed, but only abnormal results are displayed) Labs Reviewed - No data to display  EKG   Radiology No results found.  Procedures Procedures (including critical care time)  Medications Ordered in UC Medications - No data to display  Initial Impression / Assessment and Plan / UC Course  I have reviewed the triage vital signs and the nursing notes.  Pertinent labs & imaging results that were available during my care of the patient were reviewed by me and considered in my medical decision making (see chart for details).    Assessment negative for red flags or concerns.  Otitis media of the left ear.  Will treat with amoxicillin twice daily for the next 7 days.  Tylenol and/or ibuprofen as needed.  Recommend increasing Flonase to 2 sprays in each nostril daily and continue other allergy medications as prescribed.  Encourage fluids and rest.  Follow-up as needed Final Clinical Impressions(s) / UC Diagnoses   Final diagnoses:  Non-recurrent acute suppurative otitis media of left ear without spontaneous rupture of tympanic membrane     Discharge Instructions      Take the amoxicillin twice a day for the next 7 days.   You can Tylenol and or ibuprofen as needed for pain relief and fever reduction. Increase the Flonase to 2 sprays in each nostril daily for the next  week. Continue allergy medication as previously prescribed.  Make sure you are drinking plenty of fluids, especially water.  Return or go to the Emergency Department if symptoms worsen or do not improve in the next few days.      ED Prescriptions     Medication Sig Dispense Auth. Provider   amoxicillin (AMOXIL) 500 MG capsule Take 1 capsule (500 mg total) by mouth 2 (two) times daily for 7 days. 14 capsule Pearson Forster, NP      PDMP not reviewed this encounter.   Pearson Forster, NP 04/07/21 [phone]

## 2021-04-07 NOTE — ED Triage Notes (Signed)
Pt presents with bilateral ear pain, nasal congestion, headache, and sore throat X 5 days.

## 2021-04-24 ENCOUNTER — Other Ambulatory Visit: Payer: Self-pay

## 2021-04-24 ENCOUNTER — Telehealth (HOSPITAL_COMMUNITY): Payer: Self-pay | Admitting: Emergency Medicine

## 2021-04-24 ENCOUNTER — Encounter (HOSPITAL_COMMUNITY): Payer: Self-pay | Admitting: Emergency Medicine

## 2021-04-24 ENCOUNTER — Ambulatory Visit (HOSPITAL_COMMUNITY)
Admission: EM | Admit: 2021-04-24 | Discharge: 2021-04-24 | Disposition: A | Discharge status: Home / Self Care | Payer: Medicare Other

## 2021-04-24 DIAGNOSIS — R051 Acute cough: Secondary | ICD-10-CM

## 2021-04-24 DIAGNOSIS — H66005 Acute suppurative otitis media without spontaneous rupture of ear drum, recurrent, left ear: Secondary | ICD-10-CM

## 2021-04-24 DIAGNOSIS — J40 Bronchitis, not specified as acute or chronic: Secondary | ICD-10-CM

## 2021-04-24 DIAGNOSIS — J329 Chronic sinusitis, unspecified: Secondary | ICD-10-CM

## 2021-04-24 DIAGNOSIS — R0982 Postnasal drip: Secondary | ICD-10-CM

## 2021-04-24 MED ORDER — CEFDINIR 300 MG PO CAPS: 300.0000 mg | ORAL_CAPSULE | Freq: Two times a day (BID) | ORAL | 0 refills | Status: DC

## 2021-04-24 NOTE — Discharge Instructions (Addendum)
We are treating you for an ear and sinus infection.  Please take Omnicef (cefdinir) twice daily for 10 days.  Continue with your allergy medication including Flonase and antihistamines.  I also recommend that you add Mucinex.  Since you continue to have these infections I do think it is reasonable to follow-up with an ear nose and throat doctor.  Please call them to schedule an appointment.  If you have any worsening symptoms please return for reevaluation.

## 2021-04-24 NOTE — ED Provider Notes (Signed)
[place]    CSN: [id number] Arrival date & time: 04/24/21  0815      History   Chief Complaint Chief Complaint  Patient presents with   Ear Pain    HPI Bethany Zimmerman is a 50 y.o. female.   Patient presents today with a several week history of recurrent sinus and ear pain.  She was seen on 04/07/2021 which point she was diagnosed with an ear infection.  She was given amoxicillin reports completing course of medication.  She initially had improvement of symptoms but soon after finishing antibiotic symptoms recurred.  She continues to have left otalgia, sore throat, drainage, cough, sinus pressure, headache.  She denies any fever, nausea, vomiting, chest pain, shortness of breath.  She does have a history of allergies and is taking Flonase as prescribed.  She has not seen an allergist or ENT.  She denies additional antibiotic use besides amoxicillin prescribed several weeks ago.  She does have a history of diabetes so is unable to take steroids.  She denies history of asthma, COPD, smoking.   Past Medical History:  Diagnosis Date   Diabetes mellitus without complication ([place])     There are no problems to display for this patient.   Past Surgical History:  Procedure Laterality Date   ABDOMINAL SURGERY     BACK SURGERY     CESAREAN SECTION     CHOLECYSTECTOMY     LEG SURGERY Right     OB History   No obstetric history on file.      Home Medications    Prior to Admission medications   Medication Sig Start Date End Date Taking? Authorizing Provider  cefdinir (OMNICEF) 300 MG capsule Take 1 capsule (300 mg total) by mouth 2 (two) times daily. 04/24/21  Yes Kelly Eisler K, PA-C  gabapentin (NEURONTIN) 800 MG tablet Take 800 mg by mouth 3 (three) times daily. 12/13/17  Yes [provider]  glipiZIDE (GLUCOTROL) 10 MG tablet Take 10 mg by mouth 2 (two) times daily before a meal.   Yes [provider]  HYDROcodone-acetaminophen (NORCO) 10-325  MG tablet Take 1 tablet by mouth every 6 (six) hours as needed.   Yes [provider]  metFORMIN (GLUCOPHAGE) 1000 MG tablet Take 1,000 mg by mouth 2 (two) times daily.   Yes [provider]  tiZANidine (ZANAFLEX) 4 MG tablet Take by mouth. 07/26/20  Yes [provider]  bacitracin ointment Apply 1 application topically 2 (two) times daily. 02/02/19   Petrucelli, Samantha R, PA-C  Blood Glucose Monitoring Suppl ([place]) w/Device KIT 1 (ONE) KIT AS DIRECTED 11/18/17   [provider]  cilostazol (PLETAL) 100 MG tablet TAKE 1 TABLET (100 MG TOTAL) BY MOUTH 2 (TWO) TIMES DAILY BEFORE A MEAL. 08/24/19   Serafina Mitchell, MD  fluticasone Asencion Islam) 50 MCG/ACT nasal spray  10/15/18   [provider]  JANUVIA 100 MG tablet Take 100 mg by mouth daily. Patient not taking: Reported on 04/24/2021 08/19/18   [provider]  ketoconazole (NIZORAL) 2 % cream Apply 1 application topically daily. 12/17/17   [provider]  methocarbamol (ROBAXIN) 750 MG tablet Take 750 mg by mouth 3 (three) times daily. Patient not taking: Reported on 04/24/2021 10/20/18   [provider]  Misc. Devices MISC Walker with seat Dx: M62.81 08/14/20   Volney American, PA-C  omeprazole (PRILOSEC) 20 MG capsule TAKE 1 CAPSULE BY MOUTH TWICE A DAY AS NEEDED Patient not  taking: Reported on 04/24/2021 09/30/18   [provider]  ondansetron (ZOFRAN ODT) 4 MG disintegrating tablet Take 1 tablet (4 mg total) by mouth every 8 (eight) hours as needed for nausea or vomiting. 08/14/20   Volney American, PA-C  [facility] VERIO test strip TEST 3 TIMES A DAY E11.65 11/18/17   [provider]  predniSONE (DELTASONE) 20 MG tablet Take 2 tablets (40 mg total) by mouth daily. Patient not taking: Reported on 04/24/2021 03/06/21   Hans Eden, NP  rosuvastatin (CRESTOR) 10 MG tablet Take 1 tablet (10 mg total) by mouth at bedtime. 10/24/18 10/24/19   Serafina Mitchell, MD    Family History Family History  Problem Relation Age of Onset   Heart failure Mother     Social History Social History   Tobacco Use   Smoking status: Former   Smokeless tobacco: Never  Scientific laboratory technician Use: Never used  Substance Use Topics   Alcohol use: Never   Drug use: Never     Allergies   Erythromycin, Sulfa antibiotics, and Clindamycin/lincomycin   Review of Systems Review of Systems  Constitutional:  Positive for activity change. Negative for appetite change, fatigue and fever.  HENT:  Positive for congestion, postnasal drip, sinus pressure and sore throat. Negative for sneezing.   Respiratory:  Positive for cough. Negative for shortness of breath.   Cardiovascular:  Negative for chest pain.  Gastrointestinal:  Negative for abdominal pain, diarrhea, nausea and vomiting.  Musculoskeletal:  Negative for arthralgias and myalgias.  Neurological:  Positive for headaches. Negative for dizziness and light-headedness.    Physical Exam Triage Vital Signs ED Triage Vitals  Enc Vitals Group     BP 04/24/21 0925 140/86     Pulse Rate 04/24/21 0925 100     Resp 04/24/21 0925 20     Temp 04/24/21 0925 98.5 F (36.9 C)     Temp Source 04/24/21 0925 Oral     SpO2 04/24/21 0925 96 %     Weight --      Height --      Head Circumference --      Peak Flow --      Pain Score 04/24/21 0917 8     Pain Loc --      Pain Edu? --      Excl. in [place]? --    No data found.  Updated Vital Signs BP 140/86 (BP Location: Left Arm)   Pulse 100   Temp 98.5 F (36.9 C) (Oral)   Resp 20   SpO2 96%   Visual Acuity Right Eye Distance:   Left Eye Distance:   Bilateral Distance:    Right Eye Near:   Left Eye Near:    Bilateral Near:     Physical Exam Vitals reviewed.  Constitutional:      General: She is awake. She is not in acute distress.    Appearance: Normal appearance. She is well-developed. She is not ill-appearing.     Comments: Very  pleasant female appears stated age in no acute distress sitting comfortably in exam room  HENT:     Head: Normocephalic and atraumatic.     Right Ear: Tympanic membrane, ear canal and external ear normal. Tympanic membrane is not erythematous or bulging.     Left Ear: Ear canal and external ear normal. Tympanic membrane is erythematous and bulging.     Nose:     Right Sinus: Maxillary sinus tenderness present. No frontal  sinus tenderness.     Left Sinus: Maxillary sinus tenderness present. No frontal sinus tenderness.     Mouth/Throat:     Pharynx: Uvula midline. Posterior oropharyngeal erythema present. No oropharyngeal exudate.     Comments: Significant drainage in posterior oropharynx Cardiovascular:     Rate and Rhythm: Normal rate and regular rhythm.     Heart sounds: Normal heart sounds, S1 normal and S2 normal. No murmur heard. Pulmonary:     Effort: Pulmonary effort is normal.     Breath sounds: Normal breath sounds. No wheezing, rhonchi or rales.     Comments: Clear to auscultation bilaterally Musculoskeletal:     Cervical back: Normal range of motion and neck supple.  Psychiatric:        Behavior: Behavior is cooperative.     UC Treatments / Results  Labs (all labs ordered are listed, but only abnormal results are displayed) Labs Reviewed - No data to display  EKG   Radiology No results found.  Procedures Procedures (including critical care time)  Medications Ordered in UC Medications - No data to display  Initial Impression / Assessment and Plan / UC Course  I have reviewed the triage vital signs and the nursing notes.  Pertinent labs & imaging results that were available during my care of the patient were reviewed by me and considered in my medical decision making (see chart for details).      No indication for viral testing given patient has been symptomatic for several weeks.  Patient was started on Omnicef to cover for sinus infection as well as otitis  media identified on physical exam.  Discussed that given recurrent symptoms she would benefit from seeing ENT and was given contact information for local provider with encouragement to follow-up particularly if symptoms do not quickly resolve.  Recommend she continue over-the-counter medications including Flonase and Mucinex.  Discussed alarm symptoms that warrant emergent evaluation.  Strict return precautions given to which she expressed understanding.  Final Clinical Impressions(s) / UC Diagnoses   Final diagnoses:  Sinobronchitis  Acute cough  Post-nasal drainage  Recurrent acute suppurative otitis media without spontaneous rupture of left tympanic membrane     Discharge Instructions      We are treating you for an ear and sinus infection.  Please take Omnicef (cefdinir) twice daily for 10 days.  Continue with your allergy medication including Flonase and antihistamines.  I also recommend that you add Mucinex.  Since you continue to have these infections I do think it is reasonable to follow-up with an ear nose and throat doctor.  Please call them to schedule an appointment.  If you have any worsening symptoms please return for reevaluation.     ED Prescriptions     Medication Sig Dispense Auth. Provider   cefdinir (OMNICEF) 300 MG capsule Take 1 capsule (300 mg total) by mouth 2 (two) times daily. 20 capsule Raspet, Derry Skill, PA-C      PDMP not reviewed this encounter.   Terrilee Croak, PA-C 04/24/21 [id number]

## 2021-04-24 NOTE — ED Triage Notes (Signed)
04/07/2021 was seen for ear and throat pain, started antibiotic.  Patient was getting better.  About 3 days after finishing antibiotic, symptoms came back worse.  Right ear slightly better, but left ear no better, throat hurts and significant congestion in her head.  Patient reports coughing due to throat congestion

## 2021-08-12 ENCOUNTER — Other Ambulatory Visit: Payer: Self-pay

## 2021-08-12 ENCOUNTER — Encounter (HOSPITAL_COMMUNITY): Payer: Self-pay

## 2021-08-12 ENCOUNTER — Ambulatory Visit (HOSPITAL_COMMUNITY)
Admission: RE | Admit: 2021-08-12 | Discharge: 2021-08-12 | Disposition: A | Discharge status: Home / Self Care | Payer: 59 | Source: Ambulatory Visit | Attending: Internal Medicine | Admitting: Internal Medicine

## 2021-08-12 VITALS — BP 121/72 | HR 113 | Temp 98.6°F | Resp 19

## 2021-08-12 DIAGNOSIS — A09 Infectious gastroenteritis and colitis, unspecified: Secondary | ICD-10-CM | POA: Diagnosis present

## 2021-08-12 DIAGNOSIS — R0982 Postnasal drip: Secondary | ICD-10-CM | POA: Insufficient documentation

## 2021-08-12 LAB — BASIC METABOLIC PANEL
Anion gap: 11 (ref 5–15)
BUN: 14 mg/dL (ref 6–20)
CO2: 22 mmol/L (ref 22–32)
Calcium: 9.4 mg/dL (ref 8.9–10.3)
Chloride: 97 mmol/L — ABNORMAL LOW (ref 98–111)
Creatinine, Ser: 0.78 mg/dL (ref 0.44–1.00)
GFR, Estimated: >60 mL/min (ref >60)
Glucose, Bld: 208 mg/dL — ABNORMAL HIGH (ref 70–99)
Potassium: 4 mmol/L (ref 3.5–5.1)
Sodium: 130 mmol/L — ABNORMAL LOW (ref 135–145)

## 2021-08-12 LAB — CBC WITH DIFFERENTIAL/PLATELET
Abs Immature Granulocytes: 0.04 10*3/uL (ref 0.00–0.07)
Basophils Absolute: 0.1 10*3/uL (ref 0.0–0.1)
Basophils Relative: 1 %
Eosinophils Absolute: 0.2 10*3/uL (ref 0.0–0.5)
Eosinophils Relative: 2 %
HCT: 47.5 % — ABNORMAL HIGH (ref 36.0–46.0)
Hemoglobin: 16.2 g/dL — ABNORMAL HIGH (ref 12.0–15.0)
Immature Granulocytes: 0 %
Lymphocytes Relative: 39 %
Lymphs Abs: 4.6 10*3/uL — ABNORMAL HIGH (ref 0.7–4.0)
MCH: 29 pg (ref 26.0–34.0)
MCHC: 34.1 g/dL (ref 30.0–36.0)
MCV: 85 fL (ref 80.0–100.0)
Monocytes Absolute: 0.5 10*3/uL (ref 0.1–1.0)
Monocytes Relative: 4 %
Neutro Abs: 6.4 10*3/uL (ref 1.7–7.7)
Neutrophils Relative %: 54 %
Platelets: 321 10*3/uL (ref 150–400)
RBC: 5.59 MIL/uL — ABNORMAL HIGH (ref 3.87–5.11)
RDW: 13.2 % (ref 11.5–15.5)
WBC: 11.7 10*3/uL — ABNORMAL HIGH (ref 4.0–10.5)
nRBC: 0 % (ref 0.0–0.2)

## 2021-08-12 MED ORDER — CIPROFLOXACIN HCL 500 MG PO TABS: 500.0000 mg | ORAL_TABLET | Freq: Two times a day (BID) | ORAL | 0 refills | Status: AC

## 2021-08-12 MED ORDER — METRONIDAZOLE 500 MG PO TABS: 500.0000 mg | ORAL_TABLET | Freq: Three times a day (TID) | ORAL | 0 refills | Status: AC

## 2021-08-12 MED ORDER — DICYCLOMINE HCL 20 MG PO TABS: 20.0000 mg | ORAL_TABLET | Freq: Two times a day (BID) | ORAL | 0 refills | Status: AC | PRN

## 2021-08-12 MED ORDER — FLUTICASONE PROPIONATE 50 MCG/ACT NA SUSP: 1.0000 | Freq: Every day | NASAL | 0 refills | Status: AC

## 2021-08-12 MED ORDER — ONDANSETRON 4 MG PO TBDP: 4.0000 mg | ORAL_TABLET | Freq: Three times a day (TID) | ORAL | 0 refills | Status: AC | PRN

## 2021-08-12 NOTE — ED Triage Notes (Signed)
Pt reports since Saturday having stomach issues. Reports that been congested and taking Mucinex which will irritate her stomach. Reports diarrhea. Reports her bottom is raw. Knows that is her colitis that is flared up.

## 2021-08-12 NOTE — Discharge Instructions (Addendum)
Humidifier use is recommended Vapor rub use will help with nasal congestion and cough Increase fluid intake Take medications as prescribed We will call you with recommendations if labs are abnormal Return to urgent care if you have any further concerns.

## 2021-08-12 NOTE — ED Provider Notes (Signed)
[place]    CSN: [id number] Arrival date & time: 08/12/21  1651      History   Chief Complaint Chief Complaint  Patient presents with   Abdominal Pain    HPI Bethany Zimmerman is a 51 y.o. female with a history of recurrent colitis comes to the urgent care with 3-day history of nausea, vomiting, lower abdominal crampy pain and some mucoid diarrhea.  Patient has several bouts of diarrheal bowel movements.  Patient had a few episodes of nonbloody nonbilious vomitus which subsided after she took some Zofran.  No abdominal distention.  She denies any fever or chills.  Patient's symptoms started with nasal congestion with postnasal drainage.  She continues to have significant postnasal drainage in spite of of humidifier use.  No facial pain.  Patient has been using Mucinex with no improvement in symptoms.  Patient's oral intake is poor given the history of nausea/vomiting.  She denies any dysuria urgency or frequency.[name]   HPI  Past Medical History:  Diagnosis Date   Diabetes mellitus without complication ([place])     There are no problems to display for this patient.   Past Surgical History:  Procedure Laterality Date   ABDOMINAL SURGERY     BACK SURGERY     CESAREAN SECTION     CHOLECYSTECTOMY     LEG SURGERY Right     OB History   No obstetric history on file.      Home Medications    Prior to Admission medications   Medication Sig Start Date End Date Taking? Authorizing Provider  ciprofloxacin (CIPRO) 500 MG tablet Take 1 tablet (500 mg total) by mouth every 12 (twelve) hours for 7 days. 08/12/21 08/19/21 Yes Zaniel Marineau, Myrene Galas, MD  dicyclomine (BENTYL) 20 MG tablet Take 1 tablet (20 mg total) by mouth 2 (two) times daily as needed for spasms. 08/12/21  Yes Grecia Lynk, Myrene Galas, MD  fluticasone (FLONASE) 50 MCG/ACT nasal spray Place 1 spray into both nostrils daily. 08/12/21  Yes Gwynne Kemnitz, Myrene Galas, MD  metroNIDAZOLE (FLAGYL) 500 MG tablet Take 1 tablet (500 mg  total) by mouth 3 (three) times daily for 7 days. 08/12/21 08/19/21 Yes Kamarian Sahakian, Myrene Galas, MD  ondansetron (ZOFRAN-ODT) 4 MG disintegrating tablet Take 1 tablet (4 mg total) by mouth every 8 (eight) hours as needed for nausea or vomiting. 08/12/21  Yes Leonce Bale, Myrene Galas, MD  bacitracin ointment Apply 1 application topically 2 (two) times daily. 02/02/19   Petrucelli, Samantha R, PA-C  Blood Glucose Monitoring Suppl ([place]) w/Device KIT 1 (ONE) KIT AS DIRECTED 11/18/17   [provider]  cilostazol (PLETAL) 100 MG tablet TAKE 1 TABLET (100 MG TOTAL) BY MOUTH 2 (TWO) TIMES DAILY BEFORE A MEAL. 08/24/19   Serafina Mitchell, MD  gabapentin (NEURONTIN) 800 MG tablet Take 800 mg by mouth 3 (three) times daily. 12/13/17   [provider]  glipiZIDE (GLUCOTROL) 10 MG tablet Take 10 mg by mouth 2 (two) times daily before a meal.    [provider]  HYDROcodone-acetaminophen (NORCO) 10-325 MG tablet Take 1 tablet by mouth every 6 (six) hours as needed.    [provider]  ketoconazole (NIZORAL) 2 % cream Apply 1 application topically daily. 12/17/17   [provider]  metFORMIN (GLUCOPHAGE) 1000 MG tablet Take 1,000 mg by mouth 2 (two) times daily.    [provider]  Misc. Devices MISC Walker with seat Dx: B01.75 08/14/20   Volney American, PA-C  ONETOUCH VERIO test strip TEST 3 TIMES A DAY E11.65 11/18/17   [provider]  rosuvastatin (CRESTOR) 10 MG tablet Take 1 tablet (10 mg total) by mouth at bedtime. 10/24/18 10/24/19  Serafina Mitchell, MD  tiZANidine (ZANAFLEX) 4 MG tablet Take by mouth. 07/26/20   [provider]    Family History Family History  Problem Relation Age of Onset   Heart failure Mother     Social History Social History   Tobacco Use   Smoking status: Former   Smokeless tobacco: Never  Scientific laboratory technician Use: Never used  Substance Use Topics   Alcohol use: Never   Drug use: Never      Allergies   Erythromycin, Keflex [cephalexin], Sulfa antibiotics, and Clindamycin/lincomycin   Review of Systems Review of Systems  Constitutional: Negative.   HENT:  Positive for postnasal drip. Negative for ear discharge, sinus pressure, sneezing, sore throat and voice change.   Eyes: Negative.   Gastrointestinal:  Positive for abdominal pain, diarrhea, nausea and vomiting. Negative for abdominal distention, anal bleeding and blood in stool.  Genitourinary: Negative.   Neurological: Negative.     Physical Exam Triage Vital Signs ED Triage Vitals  Enc Vitals Group     BP 08/12/21 1718 121/72     Pulse Rate 08/12/21 1718 (!) 113     Resp 08/12/21 1718 19     Temp 08/12/21 1718 98.6 F (37 C)     Temp Source 08/12/21 1718 Oral     SpO2 08/12/21 1718 96 %     Weight --      Height --      Head Circumference --      Peak Flow --      Pain Score 08/12/21 1717 10     Pain Loc --      Pain Edu? --      Excl. in [place]? --    No data found.  Updated Vital Signs BP 121/72 (BP Location: Left Arm)    Pulse (!) 113    Temp 98.6 F (37 C) (Oral)    Resp 19    SpO2 96%   Visual Acuity Right Eye Distance:   Left Eye Distance:   Bilateral Distance:    Right Eye Near:   Left Eye Near:    Bilateral Near:     Physical Exam Vitals and nursing note reviewed.  Constitutional:      General: She is not in acute distress.    Appearance: She is not ill-appearing or diaphoretic.  HENT:     Head: Normocephalic and atraumatic.  Cardiovascular:     Rate and Rhythm: Normal rate and regular rhythm.  Pulmonary:     Effort: Pulmonary effort is normal.     Breath sounds: Normal breath sounds. No wheezing.  Abdominal:     General: Abdomen is flat and scaphoid.     Palpations: Abdomen is soft. There is no shifting dullness, fluid wave, hepatomegaly or splenomegaly.     Tenderness: There is abdominal tenderness in the suprapubic area.     Hernia: No hernia is present.  Neurological:      Mental Status: She is alert.     UC Treatments / Results  Labs (all labs ordered are listed, but only abnormal results are displayed) Labs Reviewed  CBC WITH DIFFERENTIAL/PLATELET  BASIC METABOLIC PANEL    EKG   Radiology No results found.  Procedures Procedures (including critical care time)  Medications Ordered in  UC Medications - No data to display  Initial Impression / Assessment and Plan / UC Course  I have reviewed the triage vital signs and the nursing notes.  Pertinent labs & imaging results that were available during my care of the patient were reviewed by me and considered in my medical decision making (see chart for details).     1.  Infectious colitis: Ciprofloxacin 500 mg twice daily for 7 days Flagyl 500 mg 3 times daily for 7 days Bentyl as needed for abdominal cramps CBC, BMP We will call you with recommendations if labs are abnormal Return to urgent care if symptoms worsen  2.  Postnasal drainage: Saline nasal spray VapoRub and humidifier use recommended Fluticasone nasal spray Continue Singulair use Return to urgent care if symptoms worsens. Final Clinical Impressions(s) / UC Diagnoses   Final diagnoses:  Infectious colitis  Post-nasal drainage     Discharge Instructions      Humidifier use is recommended Vapor rub use will help with nasal congestion and cough Increase fluid intake Take medications as prescribed We will call you with recommendations if labs are abnormal Return to urgent care if you have any further concerns.   ED Prescriptions     Medication Sig Dispense Auth. Provider   fluticasone (FLONASE) 50 MCG/ACT nasal spray Place 1 spray into both nostrils daily. 16 g Teniqua Marron, Myrene Galas, MD   ciprofloxacin (CIPRO) 500 MG tablet Take 1 tablet (500 mg total) by mouth every 12 (twelve) hours for 7 days. 10 tablet Zubair Lofton, Myrene Galas, MD   metroNIDAZOLE (FLAGYL) 500 MG tablet Take 1 tablet (500 mg total) by mouth 3 (three)  times daily for 7 days. 21 tablet Parish Augustine, Myrene Galas, MD   ondansetron (ZOFRAN-ODT) 4 MG disintegrating tablet Take 1 tablet (4 mg total) by mouth every 8 (eight) hours as needed for nausea or vomiting. 20 tablet Kailyn Dubie, Myrene Galas, MD   dicyclomine (BENTYL) 20 MG tablet Take 1 tablet (20 mg total) by mouth 2 (two) times daily as needed for spasms. 20 tablet Keilan Nichol, Myrene Galas, MD      PDMP not reviewed this encounter.   Chase Picket, MD 08/12/21 [phone]

## 2021-09-09 IMAGING — DX DG FOOT COMPLETE 3+V*L*
3 series · 3 of 3 positions shown · non-contrast
Comparison: None.

CLINICAL DATA: Soft tissue infection for 1 week

EXAM:
LEFT FOOT - COMPLETE 3+ VIEW

[foot ap]
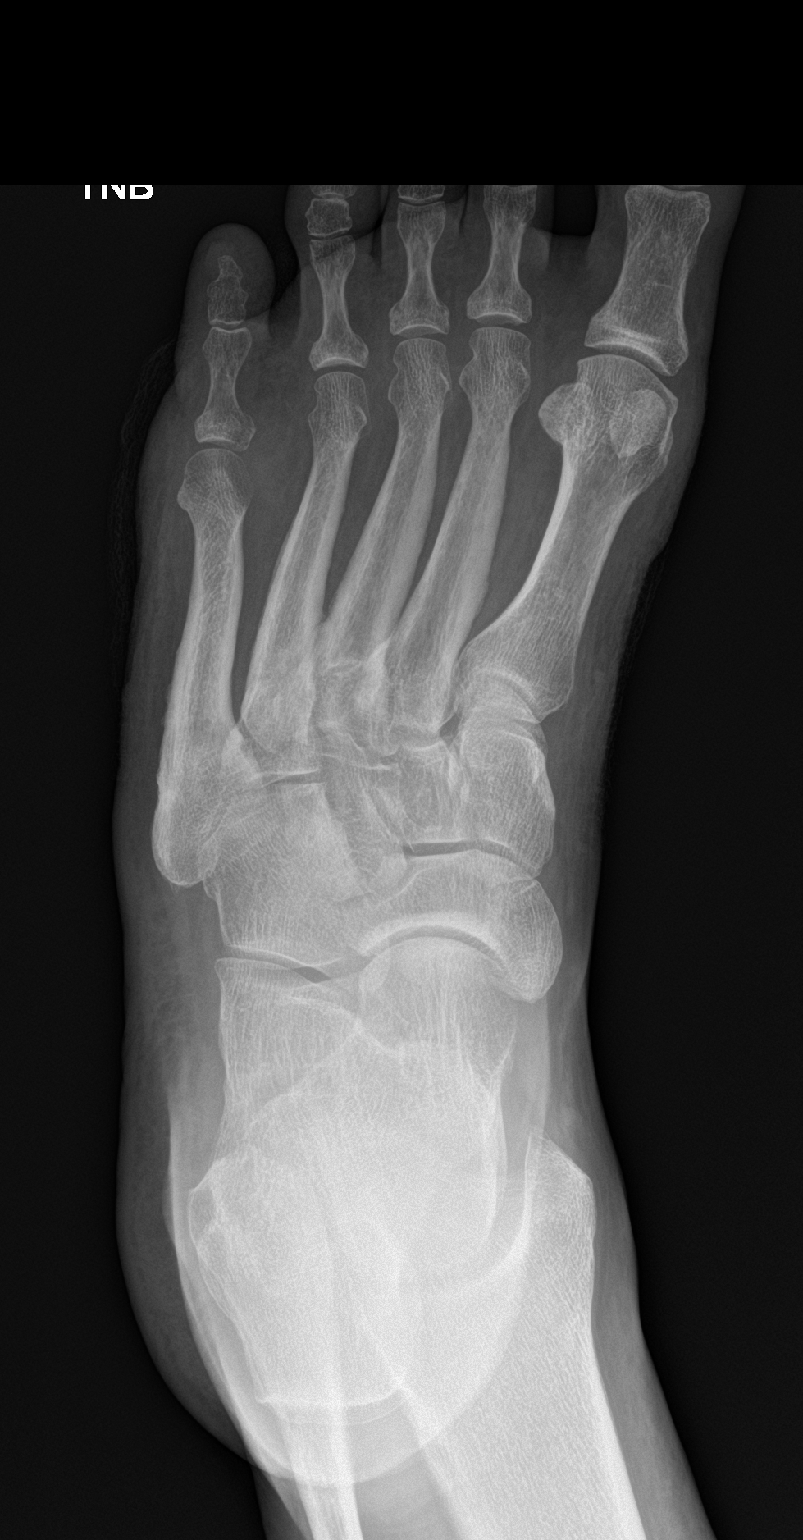

[foot obl]
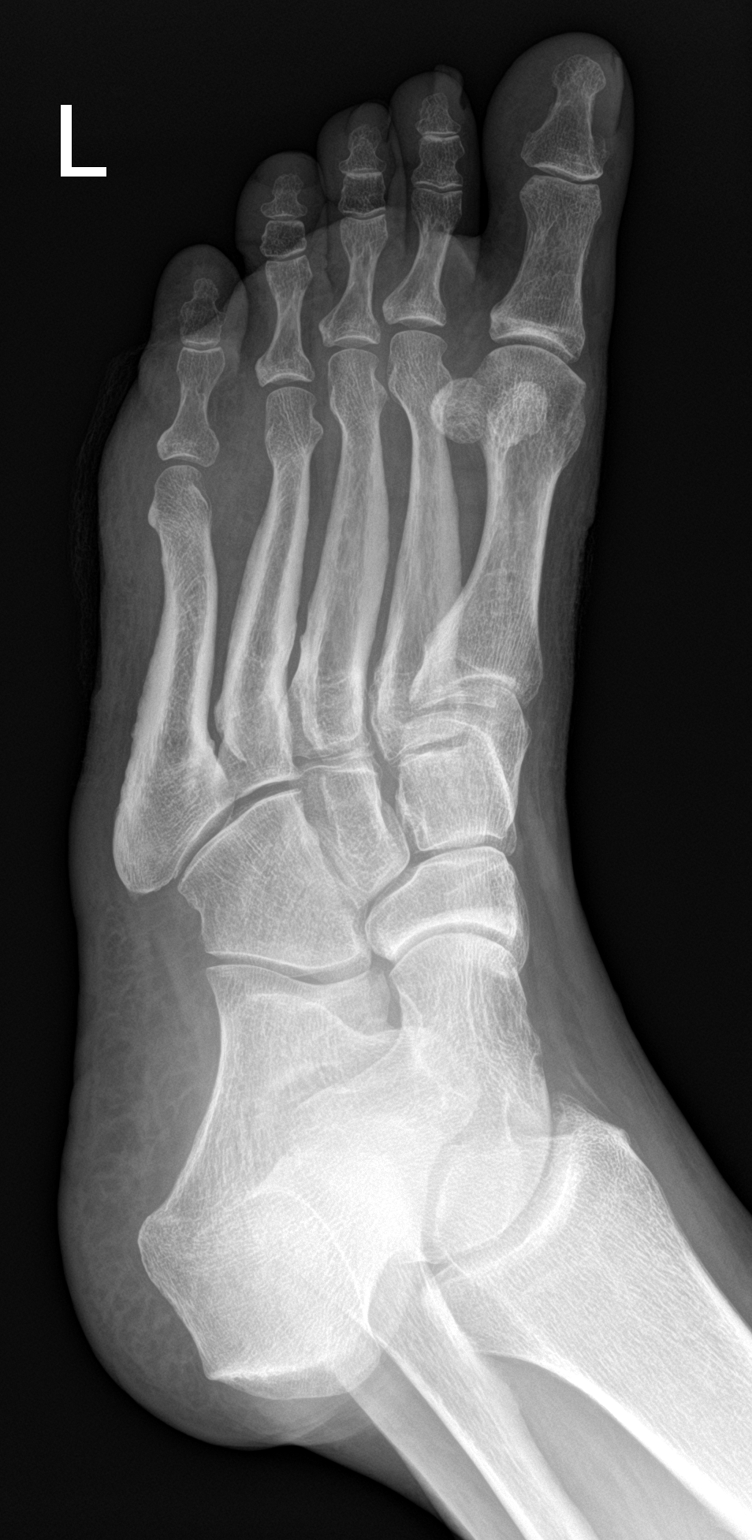

[foot lat]
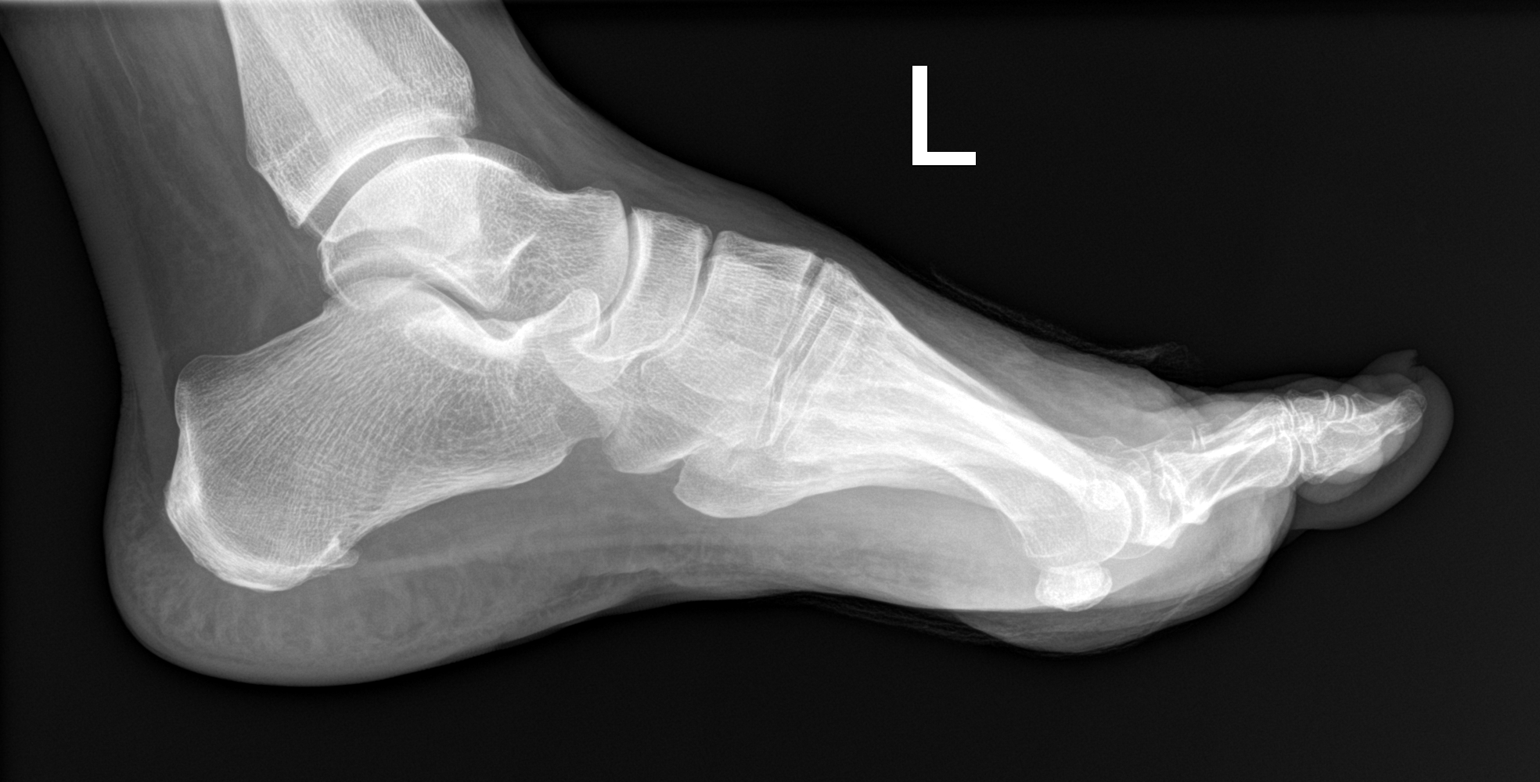

[3 of 3 positions shown; findings below may reference images not displayed]

FINDINGS: Soft tissue swelling is noted about the fifth digit consistent with
the given clinical history. No definitive bony erosive changes are
identified to suggest osteomyelitis. Questionable lucency is noted
in the base of the fifth proximal phalanx which may be related to an
undisplaced fracture.
IMPRESSION: No definitive bony erosive changes to suggest osteomyelitis. MRI
would be more sensitive in this regard.

Question undisplaced fracture at the base of the fifth proximal
phalanx.

## 2023-07-21 ENCOUNTER — Telehealth: Payer: Self-pay | Admitting: Neurology

## 2023-07-21 ENCOUNTER — Encounter: Payer: Self-pay | Admitting: Neurology

## 2023-07-21 ENCOUNTER — Ambulatory Visit: Payer: 59 | Admitting: Neurology

## 2023-07-21 NOTE — Telephone Encounter (Signed)
 Pt cx appt due to being sick, Will CB to r/s
